# Patient Record
Sex: Female | Born: 1970 | Race: Black or African American | Hispanic: No | Marital: Married | Smoking: Never smoker
Health system: Southern US, Community
[De-identification: ages and names within clinical notes are randomized; demographics above are authoritative.]

## PROBLEM LIST (undated history)

## (undated) ENCOUNTER — Ambulatory Visit (HOSPITAL_COMMUNITY): Admission: EM | Source: Home / Self Care

## (undated) DIAGNOSIS — F319 Bipolar disorder, unspecified: Secondary | ICD-10-CM

## (undated) DIAGNOSIS — F419 Anxiety disorder, unspecified: Secondary | ICD-10-CM

## (undated) DIAGNOSIS — F329 Major depressive disorder, single episode, unspecified: Secondary | ICD-10-CM

## (undated) DIAGNOSIS — F32A Depression, unspecified: Secondary | ICD-10-CM

## (undated) HISTORY — PX: ANKLE SURGERY: SHX546

## (undated) HISTORY — PX: OTHER SURGICAL HISTORY: SHX169

---

## 1997-08-07 ENCOUNTER — Emergency Department (HOSPITAL_COMMUNITY): Admission: EM | Admit: 1997-08-07 | Discharge: 1997-08-07 | Payer: Self-pay | Admitting: Emergency Medicine

## 1998-01-12 ENCOUNTER — Encounter: Admission: RE | Admit: 1998-01-12 | Discharge: 1998-01-12 | Payer: Self-pay | Admitting: Internal Medicine

## 1998-01-12 ENCOUNTER — Other Ambulatory Visit: Admission: RE | Admit: 1998-01-12 | Discharge: 1998-01-12 | Payer: Self-pay | Admitting: *Deleted

## 1998-09-04 ENCOUNTER — Encounter: Admission: RE | Admit: 1998-09-04 | Discharge: 1998-09-04 | Payer: Self-pay | Admitting: Obstetrics & Gynecology

## 1998-09-26 ENCOUNTER — Emergency Department (HOSPITAL_COMMUNITY): Admission: EM | Admit: 1998-09-26 | Discharge: 1998-09-26 | Payer: Self-pay | Admitting: Emergency Medicine

## 1998-10-09 ENCOUNTER — Encounter: Admission: RE | Admit: 1998-10-09 | Discharge: 1998-10-09 | Payer: Self-pay | Admitting: Obstetrics & Gynecology

## 1999-02-05 ENCOUNTER — Emergency Department (HOSPITAL_COMMUNITY): Admission: EM | Admit: 1999-02-05 | Discharge: 1999-02-05 | Payer: Self-pay | Admitting: Emergency Medicine

## 1999-02-25 ENCOUNTER — Emergency Department (HOSPITAL_COMMUNITY): Admission: EM | Admit: 1999-02-25 | Discharge: 1999-02-25 | Payer: Self-pay | Admitting: Emergency Medicine

## 1999-03-21 ENCOUNTER — Other Ambulatory Visit: Admission: RE | Admit: 1999-03-21 | Discharge: 1999-03-21 | Payer: Self-pay | Admitting: Obstetrics and Gynecology

## 1999-03-21 ENCOUNTER — Encounter (INDEPENDENT_AMBULATORY_CARE_PROVIDER_SITE_OTHER): Payer: Self-pay | Admitting: Specialist

## 2000-08-25 ENCOUNTER — Emergency Department (HOSPITAL_COMMUNITY): Admission: EM | Admit: 2000-08-25 | Discharge: 2000-08-25 | Payer: Self-pay | Admitting: Emergency Medicine

## 2000-08-25 ENCOUNTER — Encounter: Payer: Self-pay | Admitting: Emergency Medicine

## 2002-01-23 ENCOUNTER — Emergency Department (HOSPITAL_COMMUNITY): Admission: EM | Admit: 2002-01-23 | Discharge: 2002-01-23 | Payer: Self-pay | Admitting: Emergency Medicine

## 2002-01-25 ENCOUNTER — Inpatient Hospital Stay (HOSPITAL_COMMUNITY): Admission: AD | Admit: 2002-01-25 | Discharge: 2002-01-25 | Payer: Self-pay | Admitting: *Deleted

## 2003-08-23 ENCOUNTER — Emergency Department (HOSPITAL_COMMUNITY): Admission: EM | Admit: 2003-08-23 | Discharge: 2003-08-23 | Payer: Self-pay | Admitting: Family Medicine

## 2004-05-23 ENCOUNTER — Other Ambulatory Visit: Admission: RE | Admit: 2004-05-23 | Discharge: 2004-05-23 | Payer: Self-pay | Admitting: Obstetrics and Gynecology

## 2005-01-06 ENCOUNTER — Emergency Department (HOSPITAL_COMMUNITY): Admission: EM | Admit: 2005-01-06 | Discharge: 2005-01-06 | Payer: Self-pay | Admitting: Family Medicine

## 2005-01-12 ENCOUNTER — Encounter: Admission: RE | Admit: 2005-01-12 | Discharge: 2005-01-12 | Payer: Self-pay | Admitting: Orthopedic Surgery

## 2005-03-13 ENCOUNTER — Inpatient Hospital Stay (HOSPITAL_COMMUNITY): Admission: RE | Admit: 2005-03-13 | Discharge: 2005-03-16 | Payer: Self-pay | Admitting: Orthopedic Surgery

## 2005-12-11 ENCOUNTER — Emergency Department (HOSPITAL_COMMUNITY): Admission: EM | Admit: 2005-12-11 | Discharge: 2005-12-11 | Payer: Self-pay | Admitting: Family Medicine

## 2006-06-04 ENCOUNTER — Emergency Department (HOSPITAL_COMMUNITY): Admission: EM | Admit: 2006-06-04 | Discharge: 2006-06-04 | Payer: Self-pay | Admitting: Family Medicine

## 2007-02-02 ENCOUNTER — Emergency Department (HOSPITAL_COMMUNITY): Admission: EM | Admit: 2007-02-02 | Discharge: 2007-02-02 | Payer: Self-pay | Admitting: Emergency Medicine

## 2007-07-14 ENCOUNTER — Encounter: Admission: RE | Admit: 2007-07-14 | Discharge: 2007-07-14 | Payer: Self-pay | Admitting: Orthopedic Surgery

## 2007-07-27 ENCOUNTER — Encounter: Admission: RE | Admit: 2007-07-27 | Discharge: 2007-07-27 | Payer: Self-pay | Admitting: Obstetrics

## 2007-09-01 ENCOUNTER — Ambulatory Visit (HOSPITAL_COMMUNITY): Admission: RE | Admit: 2007-09-01 | Discharge: 2007-09-01 | Payer: Self-pay | Admitting: Obstetrics

## 2007-09-22 ENCOUNTER — Ambulatory Visit (HOSPITAL_BASED_OUTPATIENT_CLINIC_OR_DEPARTMENT_OTHER): Admission: RE | Admit: 2007-09-22 | Discharge: 2007-09-23 | Payer: Self-pay | Admitting: Orthopedic Surgery

## 2009-01-10 ENCOUNTER — Encounter: Admission: RE | Admit: 2009-01-10 | Discharge: 2009-01-10 | Payer: Self-pay | Admitting: Family Medicine

## 2009-01-12 ENCOUNTER — Encounter: Admission: RE | Admit: 2009-01-12 | Discharge: 2009-01-12 | Payer: Self-pay | Admitting: Orthopedic Surgery

## 2009-10-16 ENCOUNTER — Emergency Department (HOSPITAL_COMMUNITY): Admission: EM | Admit: 2009-10-16 | Discharge: 2009-10-16 | Payer: Self-pay | Admitting: Emergency Medicine

## 2009-11-28 ENCOUNTER — Other Ambulatory Visit: Admission: RE | Admit: 2009-11-28 | Discharge: 2009-11-28 | Payer: Self-pay | Admitting: Family Medicine

## 2010-01-14 ENCOUNTER — Encounter: Admission: RE | Admit: 2010-01-14 | Discharge: 2010-01-14 | Payer: Self-pay | Admitting: Family Medicine

## 2010-07-22 LAB — POCT URINALYSIS DIP (DEVICE)
Nitrite: NEGATIVE
Protein, ur: NEGATIVE mg/dL
Specific Gravity, Urine: 1.02 (ref 1.005–1.030)
Urobilinogen, UA: 0.2 mg/dL (ref 0.0–1.0)

## 2010-07-22 LAB — GLUCOSE, CAPILLARY: Glucose-Capillary: 111 mg/dL — ABNORMAL HIGH (ref 70–99)

## 2010-09-17 NOTE — Op Note (Signed)
[name], [name]            ACCOUNT NO.:  [id number]   MEDICAL RECORD NO.:  [id number]          PATIENT TYPE:  AMB   LOCATION:  DSC                          FACILITY:  [facility]   PHYSICIAN:  Leonides Grills, M.D.     DATE OF BIRTH:  [birth year]   DATE OF PROCEDURE:  09/22/2007  DATE OF DISCHARGE:                               OPERATIVE REPORT   PREOPERATIVE DIAGNOSES:  1. Complication of hardware, right foot.  2. Right triple arthrodesis, nonunion.  3. Right tight Achilles tendon.  4. 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   POSTOPERATIVE DIAGNOSES:  1. Complication of hardware, right foot.  2. Right triple arthrodesis, nonunion.  3. Right tight Achilles tendon.  4. 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   OPERATION:  1. Hardware removal, deep right foot.  2. Revision right triple arthrodesis with INFUSE augmentation.  3. Right iliac crest bone graft.  4. Right percutaneous tendo-Achilles lengthening.  5. Right peroneus longus and peroneus brevis tenotomies.  6. Stress x-rays.   ANESTHESIA:  General.   SURGEON:  Leonides Grills, MD   ASSISTANT:  Richardean Canal, PA-C   ESTIMATED BLOOD LOSS:  Minimal.   TOURNIQUET TIME:  2 hours.   COMPLICATIONS:  None.   DISPOSITION:  Stable to the PR.   INDICATIONS:  This is a 40 year old female who previously had a right  triple arthrodesis.  Postoperatively, she smoked marijuana and  subsequently developed a nonunion.  She was consented for the above  procedure.  All risks, which include infection, neurovascular injury,  nonunion, malunion, hardware irritation, hardware failure, persistent  pain, worsening pain, prolonged recovery, stiffness, arthritis, talar  tilt, and possible ligamentous insufficiency were all explained.  Questions were encouraged and answered.   OPERATION:  The patient was brought to the operating room and placed in  supine position.  After adequate general endotracheal  tube anesthesia  was administered as well as Ancef 1 g IV piggyback, bump was placed in  the right ipsilateral hip, and the right lower extremity as well as  right iliac crest bone graft site were prepped and draped in a sterile  manner over proximally placed thigh tourniquet.  We started the  procedure with a longitudinal incision over the right iliac crest graft  site.  Dissection was carried down through skin.  Hemostasis was  obtained.  Dissection was carried down to the iliac crest graft site  through the pannus.  Once we were there, we created a trap door using a  curved Converse osteotome and a sagittal saw and then removed cancellous  bone with a curette and Russian forceps, placed this on the back table.  We copiously irrigated the area with normal saline, applied the trap  door back, secured this with suture and drill holes, and then closed  deep tissue with 0-Vicryl.  Subcu was closed with 2-0 Vicryl.  Skin was  closed with 4-0 Monocryl.  Subcuticular stitch and Steri-Strips were  applied.  Marcaine was applied to the wound as well.  We then performed  a percutaneous tendo-Achilles lengthening with two medial and one  lateral hemisections  to the Achilles tendon.  This had an excellent  release tight Achilles tendon.  We then made a longitudinal incision  over the anterolateral aspect of the ankle where the previous incision  was then made.  Dissection was carried down through skin.  Hemostasis  was obtained.  Anterior tibialis tendon was identified and this was  retracted laterally out of harm's way.  Dissection was carried carefully  dissecting down to the dorsal aspect of the talar neck area and the  screw head was then identified.  This was then removed with a cannulated  large frag screwdriver, and this was intact with no evidence of hardware  failure.  We then made a longitudinal incision over the medial aspect of  the talonavicular joint just dorsal to the posterior tibial  tendon.  Dissection was carried down through the skin.  Hemostasis was obtained.  We then dissected just dorsal to the posterior tibial tendon and then  identified the 3.5 fully-threaded cortical screw.  This was then removed  as well.  This was broken.  We left the shaft of the screw in the talus  as rebar.  We then made an Ollier incision over the sinus tarsi area.  Dissection was carried down through the skin.  Hemostasis was obtained.  EDB was then elevated and scar tissue was removed from the sinus tarsi  area and lateral aspect talonavicular joint.  Through a percutaneous  incision distally, we then placed the small frag screwdriver and through  the Ollier incision found the screw crossing the calcaneal cuboid joint.  This was also removed as well.  This joint as well as subtalar joint and  talonavicular joint were grossly nonunited with fibrous material.  This  took quite some time to remove all the fibrous material from all three  wounds.  We incorporated all three wounds to perform this, which was  done with a curved quarter-inch osteotome, synovectomy rongeur, and  under C-arm guidance periodically throughout the procedure as well.  Once all the joints were removed of the scar tissue and fibrous union,  we then performed either fish scaling with a curved quarter-inch  osteotome or iatrogenic vascular channels with a 10-mm drill over the  entire joint surfaces of the talonavicular joint, subtalar joint, and  then calcaneal cuboid joint.  We also released the peroneus brevis and  peroneus longus tendons as well for they were tight and tendinosed.  Once this was done, we started the fixation process.  We placed the foot  on a radiolucent triangle, made a longitudinal incision in the plantar  aspect of the heel.  We then placed a 6.5 mm x 16 mm threaded cancellous  lag screw using 4.5 mm x 3.2 mm drill holes respectively.  This had  excellent purchase and maintenance of the  compression across the  subtalar joint.  Prior to placing this, we placed the iliac crest bone  graft from the hip into the subtalar joint as well as an INFUSE BMP  wafer in the subtalar joint as well, and then applied the screw.  Once  the screw was placed, this was verified in a proper position in the AP  lateral views of the ankle.  We then obtained stress x-rays of the foot,  which showed that there was no gross motion across the fusion site,  fixation, proper position, and excellent alignment as well.  We then  applied the talonavicular joint screw using again 6.5 mm x 16 mm  threaded cancellous  screw using 4.5 mm x 3.2 mm drill holes  respectively.  Again prior to putting the screw, we placed bone graft,  iliac crest graft as well as an INFUSE wafer.  The screw was placed with  excellent purchase and compression across fusion site.  Again this was  verified C-arm guidance.  AP and lateral planes showed no gross motion,  fixation, proper position, excellent alignment as well.  We then  attempted to place a 6.5 mm x 16 mm threaded cancellous screw across the  CC joint.  This was done with a 4.5 x 3.2 mm drill hole respectively.  During this process of doing this, the anterior process broke, and  because of this we used this as graft and packed this again into the  subtalar joint, lateral talonavicular joint, and CC joint area.  We  placed iliac crest graft as well into the CC joint as well as an INFUSE  wafer into this joint as well.  We then chose a Synthes small fragment  approximately 30-degree angled locking T plate and applied this to the  lateral wall of the cuboid as well as the calcaneus.  Once this was  placed, we then placed two distal-locking screws as well as a normal  screw using either 2.8 as well as 2.5-mm drill holes respectively, all  of which had excellent purchase and maintenance of the plate in a proper  position.  We then compressed the CC joint by putting the  3.5-mm fully  threaded cortical screws in a compression mode across the plate.  This  had outstanding compression and maintenance of the reduction.  This was  verified under C-arm guidance.  AP and lateral planes showed no gross  motion fixation, proper position, and excellent alignment as well.  Final stress x-rays were obtained in AP and lateral planes as well as AP  ankle, which showed no gross motion fixation, proper position, and  excellent alignment as well.  The area was copiously irrigated.  Tourniquet was deflated prior to this.  Hemostasis was obtained.  There  was no pulsatile bleeding at the end of procedure.  Deep tissues were  closed with 2-0 Vicryl, subcu was closed with 3-0 Vicryl, and skin was  closed with 4-0 nylon over all wounds.  Sterile dressing was applied.  Modified Jones dressing was applied to the ankle with the knee in  dorsiflexion.  The patient was stable to the PR.      Leonides Grills, M.D.  Electronically Signed     PB/MEDQ  D:  09/22/2007  T:  09/23/2007  Job:  [id number]

## 2010-09-20 NOTE — Op Note (Signed)
[name], [name]            ACCOUNT NO.:  [id number]   MEDICAL RECORD NO.:  [id number]          PATIENT TYPE:  INP   LOCATION:  5005                         FACILITY:  [facility]   PHYSICIAN:  Nadara Mustard, MD     DATE OF BIRTH:  December 02, 1970   DATE OF PROCEDURE:  03/13/2005  DATE OF DISCHARGE:                                 OPERATIVE REPORT   PREOPERATIVE DIAGNOSIS:  Right foot fixed pronated valgus hind foot with  posterior tibial tendon insufficiency.   POSTOPERATIVE DIAGNOSIS:  Right foot fixed pronated valgus hind foot with  posterior tibial tendon insufficiency.   PROCEDURE:  Triple arthrodesis, right foot.   SURGEON:  Nadara Mustard, M.D.   ANESTHESIA:  General.   ESTIMATED BLOOD LOSS:  Minimal.   ANTIBIOTICS:  Kefzol 1 g.   DRAINS:  None.   COMPLICATIONS:  None.   TOURNIQUET TIME:  Esmarch at the ankle for approximately 60 minutes.   DISPOSITION:  To PACU in stable condition.   INDICATIONS FOR PROCEDURE:  The patient is a 40 year old woman with a  posterior tibial tendon insufficiency of her right foot. She has failed  conservative care, has pain with activities of daily living and presents at  this time for triple arthrodesis. The risks and benefits of surgery were  discussed including infection, neurovascular injury, nonhealing of the  wounds, persistent pain, need for additional surgery. The patient states he  understands and wished proceed at this time.   DESCRIPTION OF PROCEDURE:  The patient was brought to OR room #7 and  underwent general anesthetic. After adequate level of anesthesia obtained,  the patient's right lower extremity was prepped using DuraPrep and draped  into a sterile field. An oblique incision was made over the sinus tarsi  after the foot was elevated and Esmarch was wrapped around the ankle for  tourniquet control. This was carried down.  The extensor brevis muscle was  elevated and the subtalar joint was debrided. The posterior  facette was  debrided of articular cartilage and the calcaneocuboid joint was also  debrided. There was significant degenerative changes of the calcaneocuboid  joint with large bony spurs. The talonavicular joint was then debrided third  and this was done through a dorsal incision with the anterior tibial tendon  retracted laterally and the talonavicular joint debrided from medial aspect  of the anterior tibial tendon. This was debrided of all articular cartilage.  The foot was then held in slight valgus with neutral plantar flexion and a  guidewire then inserted from the talar neck into the calcaneus obliquely.  This was measured and a 75 mm 7.3 screw was then inserted to stabilize the  subtalar joint. A second stab incision was then made and the calcaneocuboid  joint was fused using a 45 mm long 3.5 cortical screw. A third screw was  then used to stabilize the talonavicular joint and this was stabilized with  a 50 mm screw. C-arm fluoroscopy verified reduction of both AP and lateral  planes. She had good motion of her ankle with no crepitation and no  instability. The wounds were irrigated with  normal saline. The Esmarch was  released after approximately 60 minutes. The subtalar joint and the fusion  joints were packed with Grafton demineralized bone matrix and cancellous  chips. The subcu and muscle fascia was closed using 2-0 Vicryl. Skin was  closed using far-near-near-far suture with 2-0 nylon. The wound was covered  with Adaptic orthopedic sponges, sterile Webril and a Coban dressing. The  patient was extubated and taken to the PACU in stable condition.      Nadara Mustard, MD  Electronically Signed     MVD/MEDQ  D:  03/13/2005  T:  03/13/2005  Job:  [id number]

## 2010-09-20 NOTE — Discharge Summary (Signed)
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   PROCEDURE:  Right foot triple arthrodesis.   Discharged to home in stable condition with home health therapy.  Plan to  follow up in office in 2 weeks.   HISTORY OF PRESENT ILLNESS:  The patient is a 40 year old woman with a  chronic right foot pain for over a year.  She has a fixed pronated valgus  hind foot due to posterior tibial tendon insufficiency.  The patient has  failed conservative care including nonsteroidals, bracing, immobilization  and activity modification and wished to proceed with surgical intervention.  The patient underwent a right foot triple arthrodesis on March 13, 2005.  Postoperatively, she progressed well.  Her PCA was discontinued on  postoperative day two.  She was placed in a fracture boot.  The patient was  able to independently ambulate with nonweightbearing on the right and was  discharged to home in stable condition with follow-up in office in 2 weeks  on March 16, 2005.      Nadara Mustard, MD  Electronically Signed     MVD/MEDQ  D:  08/01/2005  T:  08/04/2005  Job:  [id number]

## 2010-12-12 ENCOUNTER — Other Ambulatory Visit: Payer: Self-pay | Admitting: Family Medicine

## 2010-12-12 DIAGNOSIS — Z1231 Encounter for screening mammogram for malignant neoplasm of breast: Secondary | ICD-10-CM

## 2011-01-17 ENCOUNTER — Ambulatory Visit
Admission: RE | Admit: 2011-01-17 | Discharge: 2011-01-17 | Disposition: A | Discharge status: Home / Self Care | Payer: BC Managed Care – PPO | Source: Ambulatory Visit | Attending: Family Medicine | Admitting: Family Medicine

## 2011-01-17 DIAGNOSIS — Z1231 Encounter for screening mammogram for malignant neoplasm of breast: Secondary | ICD-10-CM

## 2011-03-24 ENCOUNTER — Emergency Department (HOSPITAL_COMMUNITY)
Admission: EM | Admit: 2011-03-24 | Discharge: 2011-03-24 | Disposition: A | Discharge status: Home / Self Care | Payer: BC Managed Care – PPO

## 2011-12-11 ENCOUNTER — Other Ambulatory Visit: Payer: Self-pay | Admitting: Family Medicine

## 2011-12-11 DIAGNOSIS — Z1231 Encounter for screening mammogram for malignant neoplasm of breast: Secondary | ICD-10-CM

## 2012-01-19 ENCOUNTER — Ambulatory Visit: Payer: BC Managed Care – PPO

## 2012-01-21 ENCOUNTER — Ambulatory Visit
Admission: RE | Admit: 2012-01-21 | Discharge: 2012-01-21 | Disposition: A | Discharge status: Home / Self Care | Payer: BC Managed Care – PPO | Source: Ambulatory Visit | Attending: Family Medicine | Admitting: Family Medicine

## 2012-01-21 DIAGNOSIS — Z1231 Encounter for screening mammogram for malignant neoplasm of breast: Secondary | ICD-10-CM

## 2012-01-22 ENCOUNTER — Ambulatory Visit: Payer: BC Managed Care – PPO

## 2012-02-09 ENCOUNTER — Ambulatory Visit: Payer: BC Managed Care – PPO | Admitting: *Deleted

## 2012-06-08 ENCOUNTER — Other Ambulatory Visit: Payer: Self-pay | Admitting: Nurse Practitioner

## 2012-06-08 ENCOUNTER — Other Ambulatory Visit (HOSPITAL_COMMUNITY)
Admission: RE | Admit: 2012-06-08 | Discharge: 2012-06-08 | Disposition: A | Discharge status: Home / Self Care | Payer: BC Managed Care – PPO | Source: Ambulatory Visit | Attending: Obstetrics and Gynecology | Admitting: Obstetrics and Gynecology

## 2012-06-08 DIAGNOSIS — Z01419 Encounter for gynecological examination (general) (routine) without abnormal findings: Secondary | ICD-10-CM | POA: Insufficient documentation

## 2012-06-08 DIAGNOSIS — Z1151 Encounter for screening for human papillomavirus (HPV): Secondary | ICD-10-CM | POA: Insufficient documentation

## 2012-06-08 DIAGNOSIS — N760 Acute vaginitis: Secondary | ICD-10-CM | POA: Insufficient documentation

## 2012-06-14 ENCOUNTER — Telehealth: Payer: Self-pay | Admitting: Genetic Counselor

## 2012-06-14 NOTE — Telephone Encounter (Signed)
S/W pt in re Genetic appt 02/17 @ 10 w/Karen Lowell Guitar.  Welcome packet mailed.

## 2012-06-21 ENCOUNTER — Encounter: Payer: BC Managed Care – PPO | Admitting: Genetic Counselor

## 2012-06-21 ENCOUNTER — Other Ambulatory Visit: Payer: BC Managed Care – PPO | Admitting: Lab

## 2012-07-01 ENCOUNTER — Other Ambulatory Visit: Payer: Self-pay | Admitting: Nurse Practitioner

## 2012-12-09 ENCOUNTER — Encounter (HOSPITAL_COMMUNITY): Payer: Self-pay | Admitting: Pharmacist

## 2012-12-20 ENCOUNTER — Ambulatory Visit (HOSPITAL_COMMUNITY)
Admission: RE | Admit: 2012-12-20 | Payer: BC Managed Care – PPO | Source: Ambulatory Visit | Admitting: Obstetrics and Gynecology

## 2012-12-20 ENCOUNTER — Encounter (HOSPITAL_COMMUNITY): Admission: RE | Payer: Self-pay | Source: Ambulatory Visit

## 2012-12-20 SURGERY — DILATION AND CURETTAGE
Anesthesia: Choice

## 2013-01-11 ENCOUNTER — Other Ambulatory Visit: Payer: Self-pay

## 2013-01-11 DIAGNOSIS — Z1231 Encounter for screening mammogram for malignant neoplasm of breast: Secondary | ICD-10-CM

## 2013-01-25 ENCOUNTER — Ambulatory Visit
Admission: RE | Admit: 2013-01-25 | Discharge: 2013-01-25 | Disposition: A | Discharge status: Home / Self Care | Payer: BC Managed Care – PPO | Source: Ambulatory Visit

## 2013-01-25 DIAGNOSIS — Z1231 Encounter for screening mammogram for malignant neoplasm of breast: Secondary | ICD-10-CM

## 2013-06-02 ENCOUNTER — Ambulatory Visit (HOSPITAL_COMMUNITY)
Admission: RE | Admit: 2013-06-02 | Payer: BC Managed Care – PPO | Source: Ambulatory Visit | Admitting: Obstetrics and Gynecology

## 2013-06-02 ENCOUNTER — Encounter (HOSPITAL_COMMUNITY): Admission: RE | Payer: Self-pay | Source: Ambulatory Visit

## 2013-06-02 SURGERY — LAPAROSCOPY, DIAGNOSTIC
Anesthesia: Choice

## 2013-06-08 ENCOUNTER — Ambulatory Visit: Payer: BC Managed Care – PPO | Admitting: Licensed Clinical Social Worker

## 2013-09-28 ENCOUNTER — Emergency Department (INDEPENDENT_AMBULATORY_CARE_PROVIDER_SITE_OTHER)
Admission: EM | Admit: 2013-09-28 | Discharge: 2013-09-28 | Disposition: A | Discharge status: Home / Self Care | Payer: BC Managed Care – PPO | Source: Home / Self Care

## 2013-09-28 ENCOUNTER — Emergency Department (INDEPENDENT_AMBULATORY_CARE_PROVIDER_SITE_OTHER): Payer: BC Managed Care – PPO

## 2013-09-28 ENCOUNTER — Encounter (HOSPITAL_COMMUNITY): Payer: Self-pay | Admitting: Emergency Medicine

## 2013-09-28 DIAGNOSIS — M25579 Pain in unspecified ankle and joints of unspecified foot: Secondary | ICD-10-CM

## 2013-09-28 DIAGNOSIS — M25571 Pain in right ankle and joints of right foot: Secondary | ICD-10-CM

## 2013-09-28 MED ORDER — HYDROCODONE-ACETAMINOPHEN 5-325 MG PO TABS: 1.0000 | ORAL_TABLET | ORAL | Status: DC | PRN

## 2013-09-28 NOTE — ED Notes (Signed)
Patient requests to apply ASO on return to home, as she does not have a sock w her

## 2013-09-28 NOTE — ED Provider Notes (Signed)
CSN: [id number]     Arrival date & time 09/28/13  1330 History   First MD Initiated Contact with Patient 09/28/13 1411     Chief Complaint  Patient presents with  . Ankle Pain   (Consider location/radiation/quality/duration/timing/severity/associated sxs/prior Treatment) HPI Comments: 43 year old female complaining of right ankle pain. She is status post surgical procedures to the ankle for preoperative diagnoses including complication of hardware right foot, apply triple arthrodesis, nonunion, right eye Achilles tendon, pronator brevis longus tendinopathy with contracture.. She has also had revision of a right triple arthrodesis, bone graft. This note dictated 09/17/2010. Pt st she has daily pain. 3 d ago experienced increased pain in the ankle and persists. No known new injury.    History reviewed. No pertinent past medical history. Past Surgical History  Procedure Laterality Date  . Ankle surgery     History reviewed. No pertinent family history. History  Substance Use Topics  . Smoking status: Never Smoker   . Smokeless tobacco: Not on file  . Alcohol Use: No   OB History   Grav Para Term Preterm Abortions TAB SAB Ect Mult Living                 Review of Systems  Constitutional: Negative for fever, chills and activity change.  Respiratory: Negative.   Musculoskeletal: Positive for gait problem.       As per HPI  Skin: Negative for color change, pallor and rash.  Neurological: Negative.     Allergies  Review of patient's allergies indicates no known allergies.  Home Medications   Prior to Admission medications   Medication Sig Start Date End Date Taking? Authorizing Provider  ibuprofen (ADVIL,MOTRIN) 800 MG tablet Take 800 mg by mouth every 8 (eight) hours as needed for pain.    Historical Provider, MD  norethindrone (MICRONOR,CAMILA,ERRIN) 0.35 MG tablet Take 1 tablet by mouth daily. Nora-BE 0.35 mg    Historical Provider, MD  OVER THE COUNTER MEDICATION Take 500  mg by mouth daily as needed. Infinity  (weight loss pill)    Historical Provider, MD   BP 134/85  Pulse 90  Temp(Src) 98.2 F (36.8 C) (Oral)  Resp 18  SpO2 97%  LMP 09/13/2013 Physical Exam  Nursing note and vitals reviewed. Constitutional: She is oriented to person, place, and time. She appears well-developed and well-nourished. No distress.  Neck: Normal range of motion. Neck supple.  Pulmonary/Chest: Effort normal. No respiratory distress.  Musculoskeletal:  Mild puffiness of ankle. Generalized ankle tenderness. Pt does not wish to move it around due to pain. No erythema or other signs of infection. No open wounds. Able to wiggle toes.  Neurological: She is alert and oriented to person, place, and time.  Skin: Skin is warm and dry.    ED Course  Procedures (including critical care time) Labs Review Labs Reviewed - No data to display  Imaging Review Dg Ankle Complete Right  09/28/2013   CLINICAL DATA:  Persistent right foot and ankle pain. Previous hindfoot fusion including the talus, calcaneus, cuboid, and navicular.  EXAM: RIGHT ANKLE - COMPLETE 3+ VIEW  COMPARISON:  CT scan dated 01/12/2009  FINDINGS: Side plate and multiple screws are present in the talus and calcaneus and cuboid and navicular. These appear unchanged. No evidence of loosening. 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 There is soft tissue swelling around the ankle. There is slight progression of dorsal spurring at the joint between the navicular  and the cuneiforms and at the tarsometatarsal joints dorsally.  No acute osseous abnormalities. Calcification in the plantar fascia is unchanged.  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   Electronically Signed   By: Geanie Cooley M.D.   On: 09/28/2013 14:58     MDM   1. Right ankle pain    ASO RICE NOrco 5 mg #15 F/U with  PCP    Hayden Rasmussen, NP 09/28/13 1546

## 2013-09-28 NOTE — ED Notes (Signed)
States she has been having problem w her right ankle for some time. Most recently, has ben having pain past 3-4 days  ( no injury ) made statement she thinks a screw may have broken off from one of her ankle surgeries

## 2013-09-28 NOTE — Discharge Instructions (Signed)
Ankle Pain °Ankle pain is a common symptom. The bones, cartilage, tendons, and muscles of the ankle joint perform a lot of work each day. The ankle joint holds your body weight and allows you to move around. Ankle pain can occur on either side or back of 1 or both ankles. Ankle pain may be sharp and burning or dull and aching. There may be tenderness, stiffness, redness, or warmth around the ankle. The pain occurs more often when a person walks or puts pressure on the ankle. °CAUSES  °There are many reasons ankle pain can develop. It is important to work with your caregiver to identify the cause since many conditions can impact the bones, cartilage, muscles, and tendons. Causes for ankle pain include: °· Injury, including a break (fracture), sprain, or strain often due to a fall, sports, or a high-impact activity. °· Swelling (inflammation) of a tendon (tendonitis). °· Achilles tendon rupture. °· Ankle instability after repeated sprains and strains. °· Poor foot alignment. °· Pressure on a nerve (tarsal tunnel syndrome). °· Arthritis in the ankle or the lining of the ankle. °· Crystal formation in the ankle (gout or pseudogout). °DIAGNOSIS  °A diagnosis is based on your medical history, your symptoms, results of your physical exam, and results of diagnostic tests. Diagnostic tests may include X-ray exams or a computerized magnetic scan (magnetic resonance imaging, MRI). °TREATMENT  °Treatment will depend on the cause of your ankle pain and may include: °· Keeping pressure off the ankle and limiting activities. °· Using crutches or other walking support (a cane or brace). °· Using rest, ice, compression, and elevation. °· Participating in physical therapy or home exercises. °· Wearing shoe inserts or special shoes. °· Losing weight. °· Taking medications to reduce pain or swelling or receiving an injection. °· Undergoing surgery. °HOME CARE INSTRUCTIONS  °· Only take over-the-counter or prescription medicines for  pain, discomfort, or fever as directed by your caregiver. °· Put ice on the injured area. °· Put ice in a plastic bag. °· Place a towel between your skin and the bag. °· Leave the ice on for 15-20 minutes at a time, 03-04 times a day. °· Keep your leg raised (elevated) when possible to lessen swelling. °· Avoid activities that cause ankle pain. °· Follow specific exercises as directed by your caregiver. °· Record how often you have ankle pain, the location of the pain, and what it feels like. This information may be helpful to you and your caregiver. °· Ask your caregiver about returning to work or sports and whether you should drive. °· Follow up with your caregiver for further examination, therapy, or testing as directed. °SEEK MEDICAL CARE IF:  °· Pain or swelling continues or worsens beyond 1 week. °· You have an oral temperature above 102° F (38.9° C). °· You are feeling unwell or have chills. °· You are having an increasingly difficult time with walking. °· You have loss of sensation or other new symptoms. °· You have questions or concerns. °MAKE SURE YOU:  °· Understand these instructions. °· Will watch your condition. °· Will get help right away if you are not doing well or get worse. °Document Released: 10/09/2009 Document Revised: 07/14/2011 Document Reviewed: 10/09/2009 °[employer]® Patient Information ©2014 [employer]. ° °Arthralgia °Arthralgia is joint pain. A joint is a place where two bones meet. Joint pain can happen for many reasons. The joint can be bruised, stiff, infected, or weak from aging. Pain usually goes away after resting and taking medicine for   soreness.  °HOME CARE °· Rest the joint as told by your doctor. °· Keep the sore joint raised (elevated) for the first 24 hours. °· Put ice on the joint area. °· Put ice in a plastic bag. °· Place a towel between your skin and the bag. °· Leave the ice on for 15-20 minutes, 03-04 times a day. °· Wear your splint, casting, elastic bandage, or sling  as told by your doctor. °· Only take medicine as told by your doctor. Do not take aspirin. °· Use crutches as told by your doctor. Do not put weight on the joint until told to by your doctor. °GET HELP RIGHT AWAY IF:  °· You have bruising, puffiness (swelling), or more pain. °· Your fingers or toes turn blue or start to lose feeling (numb). °· Your medicine does not lessen the pain. °· Your pain becomes severe. °· You have a temperature by mouth above 102° F (38.9° C), not controlled by medicine. °· You cannot move or use the joint. °MAKE SURE YOU:  °· Understand these instructions. °· Will watch your condition. °· Will get help right away if you are not doing well or get worse. °Document Released: 04/09/2009 Document Revised: 07/14/2011 Document Reviewed: 04/09/2009 °Exit[employer]® Patient Information ©2014 [employer]. ° °

## 2013-09-28 NOTE — ED Notes (Signed)
Pt declined appliance application, but says she will put it on when she returns to her home. Pt. Was given XL size ASO for her stated shoe size of ladies' 14".

## 2013-09-29 NOTE — ED Provider Notes (Signed)
Medical screening examination/treatment/procedure(s) were performed by a resident physician or non-physician practitioner and as the supervising physician I was immediately available for consultation/collaboration.  Yasuko Lapage, MD    Kell Ferris S Skyelar Swigart, MD 09/29/13 0754 

## 2014-01-17 ENCOUNTER — Other Ambulatory Visit: Payer: Self-pay

## 2014-01-17 DIAGNOSIS — Z1231 Encounter for screening mammogram for malignant neoplasm of breast: Secondary | ICD-10-CM

## 2014-03-15 ENCOUNTER — Ambulatory Visit: Payer: BC Managed Care – PPO

## 2014-03-17 ENCOUNTER — Ambulatory Visit: Payer: BC Managed Care – PPO

## 2014-03-20 ENCOUNTER — Ambulatory Visit
Admission: RE | Admit: 2014-03-20 | Discharge: 2014-03-20 | Disposition: A | Discharge status: Home / Self Care | Payer: 59 | Source: Ambulatory Visit

## 2014-03-20 DIAGNOSIS — Z1231 Encounter for screening mammogram for malignant neoplasm of breast: Secondary | ICD-10-CM

## 2014-12-28 ENCOUNTER — Emergency Department (INDEPENDENT_AMBULATORY_CARE_PROVIDER_SITE_OTHER)
Admission: EM | Admit: 2014-12-28 | Discharge: 2014-12-28 | Disposition: A | Discharge status: Home / Self Care | Payer: Self-pay | Source: Home / Self Care | Attending: Family Medicine | Admitting: Family Medicine

## 2014-12-28 ENCOUNTER — Encounter (HOSPITAL_COMMUNITY): Payer: Self-pay | Admitting: Emergency Medicine

## 2014-12-28 DIAGNOSIS — M5430 Sciatica, unspecified side: Secondary | ICD-10-CM

## 2014-12-28 DIAGNOSIS — M62830 Muscle spasm of back: Secondary | ICD-10-CM

## 2014-12-28 HISTORY — DX: Depression, unspecified: F32.A

## 2014-12-28 HISTORY — DX: Major depressive disorder, single episode, unspecified: F32.9

## 2014-12-28 MED ORDER — METHOCARBAMOL 500 MG PO TABS
500.0000 mg | ORAL_TABLET | Freq: Four times a day (QID) | ORAL | Status: DC | PRN
Start: 2014-12-28 — End: 2015-09-01

## 2014-12-28 MED ORDER — DICLOFENAC SODIUM 75 MG PO TBEC: 75.0000 mg | DELAYED_RELEASE_TABLET | Freq: Two times a day (BID) | ORAL | Status: DC

## 2014-12-28 MED ORDER — KETOROLAC TROMETHAMINE 60 MG/2ML IM SOLN
INTRAMUSCULAR | Status: AC
  Filled 2014-12-28: qty 2

## 2014-12-28 MED ORDER — PREDNISONE 50 MG PO TABS: ORAL_TABLET | ORAL | Status: DC

## 2014-12-28 MED ORDER — KETOROLAC TROMETHAMINE 60 MG/2ML IM SOLN
60.0000 mg | Freq: Once | INTRAMUSCULAR | Status: AC
  Administered 2014-12-28: 60 mg via INTRAMUSCULAR

## 2014-12-28 NOTE — Discharge Instructions (Signed)
Your condition is likely due to muscle spasm of the lower back causing pinching of the nerves, primarily the sciatic nerve. Please begin the exercises outlined below. Your given a dose of Toradol in our clinic to help jump start your recovery period please start taking the steroids tomorrow morning with breakfast. Please start the Voltaren medicine after finishing the steroids. You may use the Robaxin for additional pain and spasm relief. The Robaxin may make you sleepy. Please remember to stay active.   Sciatica with Rehab The sciatic nerve runs from the back down the leg and is responsible for sensation and control of the muscles in the back (posterior) side of the thigh, lower leg, and foot. Sciatica is a condition that is characterized by inflammation of this nerve.  SYMPTOMS   Signs of nerve damage, including numbness and/or weakness along the posterior side of the lower extremity.  Pain in the back of the thigh that may also travel down the leg.  Pain that worsens when sitting for long periods of time.  Occasionally, pain in the back or buttock. CAUSES  Inflammation of the sciatic nerve is the cause of sciatica. The inflammation is due to something irritating the nerve. Common sources of irritation include:  Sitting for long periods of time.  Direct trauma to the nerve.  Arthritis of the spine.  Herniated or ruptured disk.  Slipping of the vertebrae (spondylolisthesis).  Pressure from soft tissues, such as muscles or ligament-like tissue (fascia). RISK INCREASES WITH:  Sports that place pressure or stress on the spine (football or weightlifting).  Poor strength and flexibility.  Failure to warm up properly before activity.  Family history of low back pain or disk disorders.  Previous back injury or surgery.  Poor body mechanics, especially when lifting, or poor posture. PREVENTION   Warm up and stretch properly before activity.  Maintain physical  fitness:  Strength, flexibility, and endurance.  Cardiovascular fitness.  Learn and use proper technique, especially with posture and lifting. When possible, have coach correct improper technique.  Avoid activities that place stress on the spine. PROGNOSIS If treated properly, then sciatica usually resolves within 6 weeks. However, occasionally surgery is necessary.  RELATED COMPLICATIONS   Permanent nerve damage, including pain, numbness, tingle, or weakness.  Chronic back pain.  Risks of surgery: infection, bleeding, nerve damage, or damage to surrounding tissues. TREATMENT Treatment initially involves resting from any activities that aggravate your symptoms. The use of ice and medication may help reduce pain and inflammation. The use of strengthening and stretching exercises may help reduce pain with activity. These exercises may be performed at home or with referral to a therapist. A therapist may recommend further treatments, such as transcutaneous electronic nerve stimulation (TENS) or ultrasound. Your caregiver may recommend corticosteroid injections to help reduce inflammation of the sciatic nerve. If symptoms persist despite non-surgical (conservative) treatment, then surgery may be recommended. MEDICATION  If pain medication is necessary, then nonsteroidal anti-inflammatory medications, such as aspirin and ibuprofen, or other minor pain relievers, such as acetaminophen, are often recommended.  Do not take pain medication for 7 days before surgery.  Prescription pain relievers may be given if deemed necessary by your caregiver. Use only as directed and only as much as you need.  Ointments applied to the skin may be helpful.  Corticosteroid injections may be given by your caregiver. These injections should be reserved for the most serious cases, because they may only be given a certain number of times. HEAT AND COLD  Cold treatment (icing) relieves pain and reduces  inflammation. Cold treatment should be applied for 10 to 15 minutes every 2 to 3 hours for inflammation and pain and immediately after any activity that aggravates your symptoms. Use ice packs or massage the area with a piece of ice (ice massage).  Heat treatment may be used prior to performing the stretching and strengthening activities prescribed by your caregiver, physical therapist, or athletic trainer. Use a heat pack or soak the injury in warm water. SEEK MEDICAL CARE IF:  Treatment seems to offer no benefit, or the condition worsens.  Any medications produce adverse side effects. EXERCISES  RANGE OF MOTION (ROM) AND STRETCHING EXERCISES - Sciatica Most people with sciatic will find that their symptoms worsen with either excessive bending forward (flexion) or arching at the low back (extension). The exercises which will help resolve your symptoms will focus on the opposite motion. Your physician, physical therapist or athletic trainer will help you determine which exercises will be most helpful to resolve your low back pain. Do not complete any exercises without first consulting with your clinician. Discontinue any exercises which worsen your symptoms until you speak to your clinician. If you have pain, numbness or tingling which travels down into your buttocks, leg or foot, the goal of the therapy is for these symptoms to move closer to your back and eventually resolve. Occasionally, these leg symptoms will get better, but your low back pain may worsen; this is typically an indication of progress in your rehabilitation. Be certain to be very alert to any changes in your symptoms and the activities in which you participated in the 24 hours prior to the change. Sharing this information with your clinician will allow him/her to most efficiently treat your condition. These exercises may help you when beginning to rehabilitate your injury. Your symptoms may resolve with or without further involvement  from your physician, physical therapist or athletic trainer. While completing these exercises, remember:   Restoring tissue flexibility helps normal motion to return to the joints. This allows healthier, less painful movement and activity.  An effective stretch should be held for at least 30 seconds.  A stretch should never be painful. You should only feel a gentle lengthening or release in the stretched tissue. FLEXION RANGE OF MOTION AND STRETCHING EXERCISES: STRETCH - Flexion, Single Knee to Chest   Lie on a firm bed or floor with both legs extended in front of you.  Keeping one leg in contact with the floor, bring your opposite knee to your chest. Hold your leg in place by either grabbing behind your thigh or at your knee.  Pull until you feel a gentle stretch in your low back. Hold __________ seconds.  Slowly release your grasp and repeat the exercise with the opposite side. Repeat __________ times. Complete this exercise __________ times per day.  STRETCH - Flexion, Double Knee to Chest  Lie on a firm bed or floor with both legs extended in front of you.  Keeping one leg in contact with the floor, bring your opposite knee to your chest.  Tense your stomach muscles to support your back and then lift your other knee to your chest. Hold your legs in place by either grabbing behind your thighs or at your knees.  Pull both knees toward your chest until you feel a gentle stretch in your low back. Hold __________ seconds.  Tense your stomach muscles and slowly return one leg at a time to the floor. Repeat __________  times. Complete this exercise __________ times per day.  STRETCH - Low Trunk Rotation   Lie on a firm bed or floor. Keeping your legs in front of you, bend your knees so they are both pointed toward the ceiling and your feet are flat on the floor.  Extend your arms out to the side. This will stabilize your upper body by keeping your shoulders in contact with the  floor.  Gently and slowly drop both knees together to one side until you feel a gentle stretch in your low back. Hold for __________ seconds.  Tense your stomach muscles to support your low back as you bring your knees back to the starting position. Repeat the exercise to the other side. Repeat __________ times. Complete this exercise __________ times per day  EXTENSION RANGE OF MOTION AND FLEXIBILITY EXERCISES: STRETCH - Extension, Prone on Elbows  Lie on your stomach on the floor, a bed will be too soft. Place your palms about shoulder width apart and at the height of your head.  Place your elbows under your shoulders. If this is too painful, stack pillows under your chest.  Allow your body to relax so that your hips drop lower and make contact more completely with the floor.  Hold this position for __________ seconds.  Slowly return to lying flat on the floor. Repeat __________ times. Complete this exercise __________ times per day.  RANGE OF MOTION - Extension, Prone Press Ups  Lie on your stomach on the floor, a bed will be too soft. Place your palms about shoulder width apart and at the height of your head.  Keeping your back as relaxed as possible, slowly straighten your elbows while keeping your hips on the floor. You may adjust the placement of your hands to maximize your comfort. As you gain motion, your hands will come more underneath your shoulders.  Hold this position __________ seconds.  Slowly return to lying flat on the floor. Repeat __________ times. Complete this exercise __________ times per day.  STRENGTHENING EXERCISES - Sciatica  These exercises may help you when beginning to rehabilitate your injury. These exercises should be done near your "sweet spot." This is the neutral, low-back arch, somewhere between fully rounded and fully arched, that is your least painful position. When performed in this safe range of motion, these exercises can be used for people who  have either a flexion or extension based injury. These exercises may resolve your symptoms with or without further involvement from your physician, physical therapist or athletic trainer. While completing these exercises, remember:   Muscles can gain both the endurance and the strength needed for everyday activities through controlled exercises.  Complete these exercises as instructed by your physician, physical therapist or athletic trainer. Progress with the resistance and repetition exercises only as your caregiver advises.  You may experience muscle soreness or fatigue, but the pain or discomfort you are trying to eliminate should never worsen during these exercises. If this pain does worsen, stop and make certain you are following the directions exactly. If the pain is still present after adjustments, discontinue the exercise until you can discuss the trouble with your clinician. STRENGTHENING - Deep Abdominals, Pelvic Tilt   Lie on a firm bed or floor. Keeping your legs in front of you, bend your knees so they are both pointed toward the ceiling and your feet are flat on the floor.  Tense your lower abdominal muscles to press your low back into the floor. This motion will  rotate your pelvis so that your tail bone is scooping upwards rather than pointing at your feet or into the floor.  With a gentle tension and even breathing, hold this position for __________ seconds. Repeat __________ times. Complete this exercise __________ times per day.  STRENGTHENING - Abdominals, Crunches   Lie on a firm bed or floor. Keeping your legs in front of you, bend your knees so they are both pointed toward the ceiling and your feet are flat on the floor. Cross your arms over your chest.  Slightly tip your chin down without bending your neck.  Tense your abdominals and slowly lift your trunk high enough to just clear your shoulder blades. Lifting higher can put excessive stress on the low back and does not  further strengthen your abdominal muscles.  Control your return to the starting position. Repeat __________ times. Complete this exercise __________ times per day.  STRENGTHENING - Quadruped, Opposite UE/LE Lift  Assume a hands and knees position on a firm surface. Keep your hands under your shoulders and your knees under your hips. You may place padding under your knees for comfort.  Find your neutral spine and gently tense your abdominal muscles so that you can maintain this position. Your shoulders and hips should form a rectangle that is parallel with the floor and is not twisted.  Keeping your trunk steady, lift your right hand no higher than your shoulder and then your left leg no higher than your hip. Make sure you are not holding your breath. Hold this position __________ seconds.  Continuing to keep your abdominal muscles tense and your back steady, slowly return to your starting position. Repeat with the opposite arm and leg. Repeat __________ times. Complete this exercise __________ times per day.  STRENGTHENING - Abdominals and Quadriceps, Straight Leg Raise   Lie on a firm bed or floor with both legs extended in front of you.  Keeping one leg in contact with the floor, bend the other knee so that your foot can rest flat on the floor.  Find your neutral spine, and tense your abdominal muscles to maintain your spinal position throughout the exercise.  Slowly lift your straight leg off the floor about 6 inches for a count of 15, making sure to not hold your breath.  Still keeping your neutral spine, slowly lower your leg all the way to the floor. Repeat this exercise with each leg __________ times. Complete this exercise __________ times per day. POSTURE AND BODY MECHANICS CONSIDERATIONS - Sciatica Keeping correct posture when sitting, standing or completing your activities will reduce the stress put on different body tissues, allowing injured tissues a chance to heal and  limiting painful experiences. The following are general guidelines for improved posture. Your physician or physical therapist will provide you with any instructions specific to your needs. While reading these guidelines, remember:  The exercises prescribed by your provider will help you have the flexibility and strength to maintain correct postures.  The correct posture provides the optimal environment for your joints to work. All of your joints have less wear and tear when properly supported by a spine with good posture. This means you will experience a healthier, less painful body.  Correct posture must be practiced with all of your activities, especially prolonged sitting and standing. Correct posture is as important when doing repetitive low-stress activities (typing) as it is when doing a single heavy-load activity (lifting). RESTING POSITIONS Consider which positions are most painful for you when choosing a  resting position. If you have pain with flexion-based activities (sitting, bending, stooping, squatting), choose a position that allows you to rest in a less flexed posture. You would want to avoid curling into a fetal position on your side. If your pain worsens with extension-based activities (prolonged standing, working overhead), avoid resting in an extended position such as sleeping on your stomach. Most people will find more comfort when they rest with their spine in a more neutral position, neither too rounded nor too arched. Lying on a non-sagging bed on your side with a pillow between your knees, or on your back with a pillow under your knees will often provide some relief. Keep in mind, being in any one position for a prolonged period of time, no matter how correct your posture, can still lead to stiffness. PROPER SITTING POSTURE In order to minimize stress and discomfort on your spine, you must sit with correct posture Sitting with good posture should be effortless for a healthy body.  Returning to good posture is a gradual process. Many people can work toward this most comfortably by using various supports until they have the flexibility and strength to maintain this posture on their own. When sitting with proper posture, your ears will fall over your shoulders and your shoulders will fall over your hips. You should use the back of the chair to support your upper back. Your low back will be in a neutral position, just slightly arched. You may place a small pillow or folded towel at the base of your low back for support.  When working at a desk, create an environment that supports good, upright posture. Without extra support, muscles fatigue and lead to excessive strain on joints and other tissues. Keep these recommendations in mind: CHAIR:   A chair should be able to slide under your desk when your back makes contact with the back of the chair. This allows you to work closely.  The chair's height should allow your eyes to be level with the upper part of your monitor and your hands to be slightly lower than your elbows. BODY POSITION  Your feet should make contact with the floor. If this is not possible, use a foot rest.  Keep your ears over your shoulders. This will reduce stress on your neck and low back. INCORRECT SITTING POSTURES   If you are feeling tired and unable to assume a healthy sitting posture, do not slouch or slump. This puts excessive strain on your back tissues, causing more damage and pain. Healthier options include:  Using more support, like a lumbar pillow.  Switching tasks to something that requires you to be upright or walking.  Talking a brief walk.  Lying down to rest in a neutral-spine position. PROLONGED STANDING WHILE SLIGHTLY LEANING FORWARD  When completing a task that requires you to lean forward while standing in one place for a long time, place either foot up on a stationary 2-4 inch high object to help maintain the best posture. When both  feet are on the ground, the low back tends to lose its slight inward curve. If this curve flattens (or becomes too large), then the back and your other joints will experience too much stress, fatigue more quickly and can cause pain.  CORRECT STANDING POSTURES Proper standing posture should be assumed with all daily activities, even if they only take a few moments, like when brushing your teeth. As in sitting, your ears should fall over your shoulders and your shoulders should fall  over your hips. You should keep a slight tension in your abdominal muscles to brace your spine. Your tailbone should point down to the ground, not behind your body, resulting in an over-extended swayback posture.  INCORRECT STANDING POSTURES  Common incorrect standing postures include a forward head, locked knees and/or an excessive swayback. WALKING Walk with an upright posture. Your ears, shoulders and hips should all line-up. PROLONGED ACTIVITY IN A FLEXED POSITION When completing a task that requires you to bend forward at your waist or lean over a low surface, try to find a way to stabilize 3 of 4 of your limbs. You can place a hand or elbow on your thigh or rest a knee on the surface you are reaching across. This will provide you more stability so that your muscles do not fatigue as quickly. By keeping your knees relaxed, or slightly bent, you will also reduce stress across your low back. CORRECT LIFTING TECHNIQUES DO :   Assume a wide stance. This will provide you more stability and the opportunity to get as close as possible to the object which you are lifting.  Tense your abdominals to brace your spine; then bend at the knees and hips. Keeping your back locked in a neutral-spine position, lift using your leg muscles. Lift with your legs, keeping your back straight.  Test the weight of unknown objects before attempting to lift them.  Try to keep your elbows locked down at your sides in order get the best strength  from your shoulders when carrying an object.  Always ask for help when lifting heavy or awkward objects. INCORRECT LIFTING TECHNIQUES DO NOT:   Lock your knees when lifting, even if it is a small object.  Bend and twist. Pivot at your feet or move your feet when needing to change directions.  Assume that you cannot safely pick up a paperclip without proper posture. Document Released: 04/21/2005 Document Revised: 09/05/2013 Document Reviewed: 08/03/2008 [facility] Patient Information 2015 [place], [place]. This information is not intended to replace advice given to you by your health care provider. Make sure you discuss any questions you have with your health care provider.

## 2014-12-28 NOTE — ED Notes (Signed)
C/o back pain radiating down to legs States she was getting dressed today when pain started  Does have hx of back pain States pain is worst Denies any urinary problems Does stand for 7 hrs straight

## 2014-12-28 NOTE — ED Provider Notes (Signed)
CSN: [id number]     Arrival date & time 12/28/14  1441 History   First MD Initiated Contact with Patient 12/28/14 1524     Chief Complaint  Patient presents with  . Back Pain   (Consider location/radiation/quality/duration/timing/severity/associated sxs/prior Treatment) HPI   Lower back. L and right. Came on sudeenly after bending over. Started back to work after being out for 2 years. 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 Travels to [place]. Denies difficulty walking, loss of bowel or bladder function. Has not tried anything for the pain. OTC tylenol prior to episode. Constant. Worse w/ certain movements.   Past Medical History  Diagnosis Date  . Depression    Past Surgical History  Procedure Laterality Date  . Ankle surgery    . R ankle       unsure of why she had the surgery   Family History  Problem Relation Age of Onset  . Cancer Sister   . Diabetes Other   . Hypertension Other    Social History  Substance Use Topics  . Smoking status: Never Smoker   . Smokeless tobacco: None  . Alcohol Use: No   OB History    No data available     Review of Systems Per HPI with all other pertinent systems negative.   Allergies  Review of patient's allergies indicates no known allergies.  Home Medications   Prior to Admission medications   Medication Sig Start Date End Date Taking? Authorizing Provider  sertraline (ZOLOFT) 100 MG tablet Take 100 mg by mouth daily.   Yes Historical Provider, MD  diclofenac (VOLTAREN) 75 MG EC tablet Take 1 tablet (75 mg total) by mouth 2 (two) times daily. 12/28/14   Ozella Rocks, MD  methocarbamol (ROBAXIN) 500 MG tablet Take 1-2 tablets (500-1,000 mg total) by mouth every 6 (six) hours as needed for muscle spasms. 12/28/14   Ozella Rocks, MD  OVER THE COUNTER MEDICATION Take 500 mg by mouth daily as needed. Infinity  (weight loss pill)    Historical Provider, MD  predniSONE (DELTASONE) 50 MG tablet Take daily with breakfast  12/28/14   Ozella Rocks, MD   Meds Ordered and Administered this Visit   Medications  ketorolac (TORADOL) injection 60 mg (not administered)    BP 131/86 mmHg  Pulse 85  Temp(Src) 99.1 F (37.3 C) (Oral)  Resp 16  SpO2 97%  LMP 09/13/2013 No data found.   Physical Exam Physical Exam  Constitutional: oriented to person, place, and time. appears well-developed and well-nourished. No distress.  HENT:  Head: Normocephalic and atraumatic.  Eyes: EOMI. PERRL.  Neck: Normal range of motion.  Cardiovascular: RRR, no m/r/g, 2+ distal pulses,  Pulmonary/Chest: Effort normal and breath sounds normal. No respiratory distress.  Abdominal: Soft. Bowel sounds are normal. NonTTP, no distension.  Musculoskeletal: Intermittent lumbar soft tissue paraspinal tenderness to palpation. Patient moves lower extremities without difficulty and no appreciable weakness..  Neurological: alert and oriented to person, place, and time.  Skin: Skin is warm. No rash noted. non diaphoretic.  Psychiatric: normal mood and affect. behavior is normal. Judgment and thought content normal.   ED Course  Procedures (including critical care time)  Labs Review Labs Reviewed - No data to display  Imaging Review No results found.   Visual Acuity Review  Right Eye Distance:   Left Eye Distance:   Bilateral Distance:    Right Eye Near:   Left Eye Near:  Bilateral Near:         MDM   1. Sciatica, unspecified laterality   2. Back spasm      Toradol 60 mg IM in clinic. Start steroids. Start Robaxin. Exercise.  Ozella Rocks, MD 12/28/14 [phone]

## 2015-09-01 ENCOUNTER — Emergency Department (HOSPITAL_COMMUNITY)
Admission: EM | Admit: 2015-09-01 | Discharge: 2015-09-01 | Disposition: A | Discharge status: Home / Self Care | Payer: Self-pay | Attending: Emergency Medicine | Admitting: Emergency Medicine

## 2015-09-01 ENCOUNTER — Encounter (HOSPITAL_COMMUNITY): Payer: Self-pay

## 2015-09-01 DIAGNOSIS — Z8659 Personal history of other mental and behavioral disorders: Secondary | ICD-10-CM | POA: Insufficient documentation

## 2015-09-01 DIAGNOSIS — M79642 Pain in left hand: Secondary | ICD-10-CM | POA: Insufficient documentation

## 2015-09-01 DIAGNOSIS — M79672 Pain in left foot: Secondary | ICD-10-CM | POA: Insufficient documentation

## 2015-09-01 DIAGNOSIS — M79645 Pain in left finger(s): Secondary | ICD-10-CM

## 2015-09-01 MED ORDER — PREDNISONE 50 MG PO TABS: 50.0000 mg | ORAL_TABLET | Freq: Every day | ORAL | Status: DC

## 2015-09-01 MED ORDER — HYDROCODONE-ACETAMINOPHEN 5-325 MG PO TABS: 1.0000 | ORAL_TABLET | ORAL | Status: DC | PRN

## 2015-09-01 NOTE — ED Notes (Signed)
Pt. Coming from home c/o left foot pain and left hand middle finger pain. Pt. sts pain in her foot is so bad she was not able to walk to the bathroom today and urinated on herself. Pt. Describes the pain as throbbing 10/10 pain. Swelling noted to left middle digit. Pt. Denies any injury.

## 2015-09-01 NOTE — ED Notes (Signed)
Pt. sts she is upset that the doctor did nothing for her. She sts that he did not examine her finger. Made EDP aware.

## 2015-09-01 NOTE — ED Notes (Signed)
Dr. Freida BusmanAllen speaking with patient at this time. EDP explaining finger examination and that an Xray is not indicated in her case. Pt. Agrees that EDP did examine her and sts understanding at this time.

## 2015-09-01 NOTE — ED Provider Notes (Addendum)
CSN: [id number]     Arrival date & time 09/01/15  0609 History   First MD Initiated Contact with Patient 09/01/15 [phone]     Chief Complaint  Patient presents with  . Finger Injury  . Foot Injury     (Consider location/radiation/quality/duration/timing/severity/associated sxs/prior Treatment) HPI Comments: Patient here complaining of left heel pain as well as left middle finger pain times several days. Denies any fever or chills. Denies any trauma to her joints. No rashes appreciated. Pain characterized as sharp and worse with movement and better with rest. Has used ibuprofen without relief. Denies any other joint pain. Denies a history of gout.  Patient is a 45 y.o. female presenting with foot injury. The history is provided by the patient.  Foot Injury   Past Medical History  Diagnosis Date  . Depression    Past Surgical History  Procedure Laterality Date  . Ankle surgery    . R ankle       unsure of why she had the surgery   Family History  Problem Relation Age of Onset  . Cancer Sister   . Diabetes Other   . Hypertension Other    Social History  Substance Use Topics  . Smoking status: Never Smoker   . Smokeless tobacco: None  . Alcohol Use: Yes     Comment: occasional    OB History    No data available     Review of Systems  All other systems reviewed and are negative.     Allergies  Review of patient's allergies indicates no known allergies.  Home Medications   Prior to Admission medications   Medication Sig Start Date End Date Taking? Authorizing Provider  ibuprofen (ADVIL,MOTRIN) 200 MG tablet Take 400-600 mg by mouth every 6 (six) hours as needed for moderate pain.   Yes Historical Provider, MD   BP 125/78 mmHg  Pulse 85  Temp(Src) 98.4 F (36.9 C) (Oral)  Resp 24  Ht 6\' 2"  (1.88 m)  Wt 136.079 kg  BMI 38.50 kg/m2  SpO2 98%  LMP 08/17/2015 Physical Exam  Constitutional: She is oriented to person, place, and time. She appears well-developed  and well-nourished.  Non-toxic appearance. No distress.  HENT:  Head: Normocephalic and atraumatic.  Eyes: Conjunctivae, EOM and lids are normal. Pupils are equal, round, and reactive to light.  Neck: Normal range of motion. Neck supple. No tracheal deviation present. No thyroid mass present.  Cardiovascular: Normal rate, regular rhythm and normal heart sounds.  Exam reveals no gallop.   No murmur heard. Pulmonary/Chest: Effort normal and breath sounds normal. No stridor. No respiratory distress. She has no decreased breath sounds. She has no wheezes. She has no rhonchi. She has no rales.  Abdominal: Soft. Normal appearance and bowel sounds are normal. She exhibits no distension. There is no tenderness. There is no rebound and no CVA tenderness.  Musculoskeletal: Normal range of motion. She exhibits no edema or tenderness.       Hands:      Feet:  Neurological: She is alert and oriented to person, place, and time. She has normal strength. No cranial nerve deficit or sensory deficit. GCS eye subscore is 4. GCS verbal subscore is 5. GCS motor subscore is 6.  Skin: Skin is warm and dry. No abrasion and no rash noted.  Psychiatric: She has a normal mood and affect. Her speech is normal and behavior is normal.  Nursing note and vitals reviewed.   ED Course  Procedures (including critical  care time) Labs Review Labs Reviewed - No data to display  Imaging Review No results found. I have personally reviewed and evaluated these images and lab results as part of my medical decision-making.   EKG Interpretation None      MDM   Final diagnoses:  None    Patient without evidence of septic joint. She is atraumatic left heel and left middle finger pain. Will not perform exercises at this time suspect she has a inflammatory joint condition will place on prednisone as well as given analgesics.  7:59 AM Plan back and spoke with patient due to concerns about her current ED evaluation. I  explained to her that prednisone would likely help her pain as well as the opiates prescribed. I explained to her that I examined her left middle finger and left heel appropriately and that there were no indications such as trauma or infection for your bloodwork imaging at this time. Patient was upset with this and encouraged her to take the medication at was prescribed for her and she was given return precautions  Lorre Nick, MD 09/01/15 0745  Lorre Nick, MD 09/01/15 0800

## 2015-09-01 NOTE — Discharge Instructions (Signed)

## 2015-09-07 ENCOUNTER — Encounter (HOSPITAL_COMMUNITY): Payer: Self-pay | Admitting: Emergency Medicine

## 2015-09-07 ENCOUNTER — Ambulatory Visit (HOSPITAL_COMMUNITY)
Admission: EM | Admit: 2015-09-07 | Discharge: 2015-09-07 | Disposition: A | Discharge status: Home / Self Care | Payer: Self-pay | Attending: Emergency Medicine | Admitting: Emergency Medicine

## 2015-09-07 DIAGNOSIS — S61002A Unspecified open wound of left thumb without damage to nail, initial encounter: Secondary | ICD-10-CM

## 2015-09-07 DIAGNOSIS — S61012A Laceration without foreign body of left thumb without damage to nail, initial encounter: Secondary | ICD-10-CM

## 2015-09-07 DIAGNOSIS — Z23 Encounter for immunization: Secondary | ICD-10-CM

## 2015-09-07 MED ORDER — TETANUS-DIPHTH-ACELL PERTUSSIS 5-2.5-18.5 LF-MCG/0.5 IM SUSP
0.5000 mL | Freq: Once | INTRAMUSCULAR | Status: AC
  Administered 2015-09-07: 0.5 mL via INTRAMUSCULAR

## 2015-09-07 MED ORDER — TETANUS-DIPHTH-ACELL PERTUSSIS 5-2.5-18.5 LF-MCG/0.5 IM SUSP
INTRAMUSCULAR | Status: AC
  Filled 2015-09-07: qty 0.5

## 2015-09-07 MED ORDER — LIDOCAINE-EPINEPHRINE (PF) 2 %-1:200000 IJ SOLN
INTRAMUSCULAR | Status: AC
  Filled 2015-09-07: qty 20
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
## 2015-09-07 NOTE — Discharge Instructions (Signed)
Stitches, Staples, or Adhesive Wound Closure Health care providers use stitches (sutures), staples, and certain glue (skin adhesives) to hold skin together while it heals (wound closure). You may need this treatment after you have surgery or if you cut your skin accidentally. These methods help your skin to heal more quickly and make it less likely that you will have a scar. A wound may take several months to heal completely. The type of wound you have determines when your wound gets closed. In most cases, the wound is closed as soon as possible (primary skin closure). Sometimes, closure is delayed so the wound can be cleaned and allowed to heal naturally. This reduces the chance of infection. Delayed closure may be needed if your wound:  Is caused by a bite.  Happened more than 6 hours ago.  Involves loss of skin or the tissues under the skin.  Has dirt or debris in it that cannot be removed.  Is infected. WHAT ARE THE DIFFERENT KINDS OF WOUND CLOSURES? There are many options for wound closure. The one that your health care provider uses depends on how deep and how large your wound is. Sutures Sutures are the oldest method of wound closure. Sutures can be made from natural substances, such as silk, or from synthetic materials, such as nylon and steel. They can be made from a material that your body can break down as your wound heals (absorbable), or they can be made from a material that needs to be removed from your skin (nonabsorbable). They come in many different strengths and sizes. Your health care provider attaches the sutures to a steel needle on one end. Sutures can be passed through your skin, or through the tissues beneath your skin. Then they are tied and cut. Your skin edges may be closed in one continuous stitch or in separate stitches. Sutures are strong and can be used for all kinds of wounds. Absorbable sutures may be used to close tissues under the skin. The disadvantage of sutures  is that they may cause skin reactions that lead to infection. Nonabsorbable sutures need to be removed. HOW DO I CARE FOR MY WOUND CLOSURE?  Take medicines only as directed by your health care provider.  If you were prescribed an antibiotic medicine for your wound, finish it all even if you start to feel better.  Use ointments or creams only as directed by your health care provider.  Wash your hands with soap and water before and after touching your wound.  Do not soak your wound in water. Do not take baths, swim, or use a hot tub until your health care provider approves.  Ask your health care provider when you can start showering. Cover your wound if directed by your health care provider.  Do not take out your own sutures or staples.  Do not pick at your wound. Picking can cause an infection.  Keep all follow-up visits as directed by your health care provider. This is important. HOW LONG WILL I HAVE MY WOUND CLOSURE?  Leave adhesive glue on your skin until the glue peels away.  Leave adhesive strips on your skin until the strips fall off.  Absorbable sutures will dissolve within several days.  Nonabsorbable sutures and staples must be removed. The location of the wound will determine how long they stay in. This can range from several days to a couple of weeks. WHEN SHOULD I SEEK HELP FOR MY WOUND CLOSURE? Contact your health care provider if:  You have  a fever.  You have chills.  You have drainage, redness, swelling, or pain at your wound.  There is a bad smell coming from your wound.  The skin edges of your wound start to separate after your sutures have been removed.  Your wound becomes thick, raised, and darker in color after your sutures come out (scarring).   This information is not intended to replace advice given to you by your health care provider. Make sure you discuss any questions you have with your health care provider.   Document Released: 01/14/2001  Document Revised: 05/12/2014 Document Reviewed: 09/28/2013 Elsevier Interactive Patient Education [employer]

## 2015-09-19 ENCOUNTER — Ambulatory Visit (HOSPITAL_COMMUNITY)
Admission: EM | Admit: 2015-09-19 | Discharge: 2015-09-19 | Disposition: A | Discharge status: Home / Self Care | Payer: Self-pay | Attending: Emergency Medicine | Admitting: Emergency Medicine

## 2015-09-19 ENCOUNTER — Encounter (HOSPITAL_COMMUNITY): Payer: Self-pay | Admitting: Emergency Medicine

## 2015-09-19 DIAGNOSIS — Z4802 Encounter for removal of sutures: Secondary | ICD-10-CM

## 2015-09-19 NOTE — ED Provider Notes (Signed)
CSN: [id number]     Arrival date & time 09/19/15  1456 History   First MD Initiated Contact with Patient 09/19/15 1615     Chief Complaint  Patient presents with  . Suture / Staple Removal   (Consider location/radiation/quality/duration/timing/severity/associated sxs/prior Treatment) HPI  She is a 45 year old woman here for suture removal. Sutures were replaced here on May 5. She has 3 stitches in the left thumb. She denies any redness or drainage. She states she does have a pulling sensation at times.    Past Medical History  Diagnosis Date  . Depression    Past Surgical History  Procedure Laterality Date  . Ankle surgery    . R ankle       unsure of why she had the surgery   Family History  Problem Relation Age of Onset  . Cancer Sister   . Diabetes Other   . Hypertension Other    Social History  Substance Use Topics  . Smoking status: Never Smoker   . Smokeless tobacco: None  . Alcohol Use: Yes     Comment: occasional    OB History    No data available     Review of Systems As in history of present illness Allergies  Review of patient's allergies indicates no known allergies.  Home Medications   Prior to Admission medications   Medication Sig Start Date End Date Taking? Authorizing Provider  HYDROcodone-acetaminophen (NORCO/VICODIN) 5-325 MG tablet Take 1-2 tablets by mouth every 4 (four) hours as needed. 09/01/15  Yes Lorre NickAnthony Allen, MD  ibuprofen (ADVIL,MOTRIN) 200 MG tablet Take 400-600 mg by mouth every 6 (six) hours as needed for moderate pain.   Yes Historical Provider, MD  predniSONE (DELTASONE) 50 MG tablet Take 1 tablet (50 mg total) by mouth daily. 09/01/15   Lorre NickAnthony Allen, MD   Meds Ordered and Administered this Visit  Medications - No data to display  BP 149/105 mmHg  Pulse 73  Temp(Src) 97.9 F (36.6 C) (Oral)  Resp 18  SpO2 100%  LMP 08/03/2015 (Within Days) No data found.   Physical Exam  Constitutional: She is oriented to person,  place, and time. She appears well-developed and well-nourished. No distress.  Cardiovascular: Normal rate.   Pulmonary/Chest: Effort normal.  Neurological: She is alert and oriented to person, place, and time.  Skin:  Well-healed laceration to left thumb with 3 sutures visualized. No erythema or drainage.    ED Course  .Suture Removal Date/Time: 09/19/2015 4:38 PM Performed by: Charm RingsHONIG, Chaitra Mast J Authorized by: Charm RingsHONIG, Kandance Yano J Consent: Verbal consent obtained. Risks and benefits: risks, benefits and alternatives were discussed Consent given by: patient Patient understanding: patient states understanding of the procedure being performed Patient identity confirmed: verbally with patient Time out: Immediately prior to procedure a "time out" was called to verify the correct patient, procedure, equipment, support staff and site/side marked as required. Body area: upper extremity Location details: left thumb Wound Appearance: clean Sutures Removed: 3 Facility: sutures placed in this facility Patient tolerance: Patient tolerated the procedure well with no immediate complications   (including critical care time)  Labs Review Labs Reviewed - No data to display  Imaging Review No results found.   MDM   1. Visit for suture removal    Sutures removed. Wound care discussed. Follow-up as needed.    Charm RingsErin J Caydin Yeatts, MD 09/19/15 [phone]

## 2015-09-19 NOTE — ED Notes (Signed)
Pt is here today to have stitches removed from her left thumb.  Pt reports some pain in the thumb today.  The wound is clean, dry, intact, with no redness or swelling.

## 2015-09-19 NOTE — Discharge Instructions (Signed)
The wound has healed well. You will likely have some lingering discomfort as the scar tissue remodels. Tylenol, ibuprofen, and heat will help ease the discomfort. Follow-up as needed.

## 2016-01-25 ENCOUNTER — Ambulatory Visit (HOSPITAL_COMMUNITY)
Admission: EM | Admit: 2016-01-25 | Discharge: 2016-01-25 | Disposition: A | Discharge status: Home / Self Care | Payer: Self-pay | Attending: Family Medicine | Admitting: Family Medicine

## 2016-01-25 ENCOUNTER — Encounter (HOSPITAL_COMMUNITY): Payer: Self-pay | Admitting: Emergency Medicine

## 2016-01-25 DIAGNOSIS — J069 Acute upper respiratory infection, unspecified: Secondary | ICD-10-CM

## 2016-01-25 DIAGNOSIS — J029 Acute pharyngitis, unspecified: Secondary | ICD-10-CM

## 2016-01-25 MED ORDER — PREDNISONE 50 MG PO TABS: 50.0000 mg | ORAL_TABLET | Freq: Every day | ORAL | 0 refills | Status: DC

## 2016-01-25 MED ORDER — AMOXICILLIN 875 MG PO TABS: 875.0000 mg | ORAL_TABLET | Freq: Two times a day (BID) | ORAL | 0 refills | Status: DC

## 2016-01-25 NOTE — ED Provider Notes (Signed)
[facility]    CSN: [id number] Arrival date & time: 01/25/16  4098  First Provider Contact:  First MD Initiated Contact with Patient 01/25/16 1010        History   Chief Complaint Chief Complaint  Patient presents with  . URI    HPI Tracyann Duffell is a 45 y.o. female.   This 45 year old unemployed woman who comes in with progressive sore throat over 3 days. She has developed some ear pain and diffuse aching as well as over the last couple days. She's not able to sleep easily because of the pain.  She's taken ibuprofen.      Past Medical History:  Diagnosis Date  . Depression     There are no active problems to display for this patient.   Past Surgical History:  Procedure Laterality Date  . ANKLE SURGERY    . R ankle      unsure of why she had the surgery    OB History    No data available       Home Medications    Prior to Admission medications   Medication Sig Start Date End Date Taking? Authorizing Provider  amoxicillin (AMOXIL) 875 MG tablet Take 1 tablet (875 mg total) by mouth 2 (two) times daily. 01/25/16   Elvina Sidle, MD  ibuprofen (ADVIL,MOTRIN) 200 MG tablet Take 400-600 mg by mouth every 6 (six) hours as needed for moderate pain.    Historical Provider, MD  predniSONE (DELTASONE) 50 MG tablet Take 1 tablet (50 mg total) by mouth daily. 01/25/16   Elvina Sidle, MD    Family History Family History  Problem Relation Age of Onset  . Cancer Sister   . Diabetes Other   . Hypertension Other     Social History Social History  Substance Use Topics  . Smoking status: Never Smoker  . Smokeless tobacco: Never Used  . Alcohol use Yes     Comment: occasional      Allergies   Review of patient's allergies indicates no known allergies.   Review of Systems Review of Systems  Constitutional: Positive for chills and fatigue.  HENT: Positive for congestion and sore throat.   Eyes: Negative.   Respiratory: Positive for  cough.   Cardiovascular: Negative.   Gastrointestinal: Negative.      Physical Exam Triage Vital Signs ED Triage Vitals  Enc Vitals Group     BP      Pulse      Resp      Temp      Temp src      SpO2      Weight      Height      Head Circumference      Peak Flow      Pain Score      Pain Loc      Pain Edu?      Excl. in GC?    No data found.   Updated Vital Signs BP 125/82 (BP Location: Left Arm)   Pulse 94   Temp 98.8 F (37.1 C) (Oral)   Resp 20   SpO2 100%      Physical Exam  Constitutional: She appears well-developed and well-nourished.  HENT:  Head: Normocephalic.  Right Ear: External ear normal.  Left Ear: External ear normal.  Nose: Nose normal.  Eyes: Conjunctivae are normal. Pupils are equal, round, and reactive to light.  Neck: Normal range of motion. Neck supple.  Cardiovascular: Normal rate, regular  rhythm and normal heart sounds.   Pulmonary/Chest: Effort normal and breath sounds normal.  Nursing note and vitals reviewed.    UC Treatments / Results  Labs (all labs ordered are listed, but only abnormal results are displayed) Labs Reviewed - No data to display  EKG  EKG Interpretation None       Radiology No results found.  Procedures Procedures (including critical care time)  Medications Ordered in UC Medications - No data to display   Initial Impression / Assessment and Plan / UC Course  I have reviewed the triage vital signs and the nursing notes.  Pertinent labs & imaging results that were available during my care of the patient were reviewed by me and considered in my medical decision making (see chart for details).  Clinical Course      Final Clinical Impressions(s) / UC Diagnoses   Final diagnoses:  URI (upper respiratory infection)  Sore throat    New Prescriptions New Prescriptions   AMOXICILLIN (AMOXIL) 875 MG TABLET    Take 1 tablet (875 mg total) by mouth 2 (two) times daily.     Elvina SidleKurt Dail Lerew,  MD 01/25/16 1019

## 2016-01-25 NOTE — ED Triage Notes (Signed)
Pt is here for cold sx onset 3 days associated w/ST, runny nose, bilateral ear pain, BA  Taking OTC meds w/no relief.   A&O x4... AND

## 2016-02-26 ENCOUNTER — Other Ambulatory Visit (HOSPITAL_COMMUNITY): Payer: Self-pay | Admitting: *Deleted

## 2016-02-26 DIAGNOSIS — N6320 Unspecified lump in the left breast, unspecified quadrant: Secondary | ICD-10-CM

## 2016-04-03 ENCOUNTER — Ambulatory Visit
Admission: RE | Admit: 2016-04-03 | Discharge: 2016-04-03 | Disposition: A | Discharge status: Home / Self Care | Payer: No Typology Code available for payment source | Source: Ambulatory Visit | Attending: Obstetrics and Gynecology | Admitting: Obstetrics and Gynecology

## 2016-04-03 ENCOUNTER — Encounter (HOSPITAL_COMMUNITY): Payer: Self-pay

## 2016-04-03 ENCOUNTER — Ambulatory Visit (HOSPITAL_COMMUNITY)
Admission: RE | Admit: 2016-04-03 | Discharge: 2016-04-03 | Disposition: A | Discharge status: Home / Self Care | Payer: Self-pay | Source: Ambulatory Visit | Attending: Obstetrics and Gynecology | Admitting: Obstetrics and Gynecology

## 2016-04-03 VITALS — BP 128/80 | Temp 98.2°F | Ht 74.0 in | Wt 274.2 lb

## 2016-04-03 DIAGNOSIS — N6320 Unspecified lump in the left breast, unspecified quadrant: Secondary | ICD-10-CM

## 2016-04-03 DIAGNOSIS — Z1239 Encounter for other screening for malignant neoplasm of breast: Secondary | ICD-10-CM

## 2016-04-03 DIAGNOSIS — N6322 Unspecified lump in the left breast, upper inner quadrant: Secondary | ICD-10-CM

## 2016-04-03 HISTORY — DX: Anxiety disorder, unspecified: F41.9

## 2016-04-03 NOTE — Progress Notes (Signed)
Complaints of left breast lump x 1 year that per patient has increased in size. Patient complained of left breast pain x 6 months that comes and goes. Patient rates pain at a 10 out of 10.  Pap Smear: Pap smear not completed today. Last Pap smear was 06/08/2012 at [facility] and normal with negative HPV. Per patient has no history of an abnormal Pap smear. Last Pap smear result is in EPIC.  Physical exam: Breasts Breasts symmetrical. No skin abnormalities bilateral breasts. No nipple retraction bilateral breasts. No nipple discharge bilateral breasts. No lymphadenopathy. No lumps palpated right breast. Palpated two pea sized lumps within the left breast at 10 o'clock 12 cm from the nipple and 11 o'clock 13 cm from the nipple. Complaints of tenderness when palpated lumps. [facility] and possible left breast ultrasound. Appointment scheduled for Thursday, April 03, 2016 at 1500.        Pelvic/Bimanual No Pap smear completed today since last Pap smear and HPV typing was completed 06/18/2012. Pap smear not indicated per BCCCP guidelines.   Smoking History: Patient has never smoked.  Patient Navigation: Patient education provided. [facility].

## 2016-04-03 NOTE — Patient Instructions (Signed)
Explained breast self awareness to [name]. Patient did not need a Pap smear today due to last Pap smear and HPV typing was 06/08/2012. Let her know BCCCP will cover Pap smears and HPV typing every 5 years unless has a history of abnormal Pap smears. [facility] and possible left breast ultrasound. Appointment scheduled for Thursday, April 03, 2016 at 1500. [name] verbalized understanding.  Zawadi Aplin, Kathaleen Maserhristine Poll, RN 3:51 PM

## 2016-04-04 ENCOUNTER — Encounter (HOSPITAL_COMMUNITY): Payer: Self-pay | Admitting: *Deleted

## 2016-11-08 ENCOUNTER — Encounter (HOSPITAL_COMMUNITY): Payer: Self-pay | Admitting: Emergency Medicine

## 2016-11-08 ENCOUNTER — Emergency Department (HOSPITAL_COMMUNITY): Payer: No Typology Code available for payment source

## 2016-11-08 ENCOUNTER — Emergency Department (HOSPITAL_COMMUNITY)
Admission: EM | Admit: 2016-11-08 | Discharge: 2016-11-08 | Disposition: A | Discharge status: Home / Self Care | Payer: No Typology Code available for payment source | Attending: Emergency Medicine | Admitting: Emergency Medicine

## 2016-11-08 ENCOUNTER — Other Ambulatory Visit: Payer: Self-pay

## 2016-11-08 DIAGNOSIS — R079 Chest pain, unspecified: Secondary | ICD-10-CM

## 2016-11-08 DIAGNOSIS — K219 Gastro-esophageal reflux disease without esophagitis: Secondary | ICD-10-CM | POA: Insufficient documentation

## 2016-11-08 DIAGNOSIS — R0602 Shortness of breath: Secondary | ICD-10-CM | POA: Insufficient documentation

## 2016-11-08 HISTORY — DX: Bipolar disorder, unspecified: F31.9

## 2016-11-08 LAB — CBC
HCT: 34.5 % — ABNORMAL LOW (ref 36.0–46.0)
Hemoglobin: 10.8 g/dL — ABNORMAL LOW (ref 12.0–15.0)
MCH: 22.5 pg — ABNORMAL LOW (ref 26.0–34.0)
MCHC: 31.3 g/dL (ref 30.0–36.0)
MCV: 71.7 fL — ABNORMAL LOW (ref 78.0–100.0)
Platelets: 335 10*3/uL (ref 150–400)
RBC: 4.81 MIL/uL (ref 3.87–5.11)
RDW: 18.6 % — AB (ref 11.5–15.5)
WBC: 7.1 10*3/uL (ref 4.0–10.5)

## 2016-11-08 LAB — I-STAT TROPONIN, ED: TROPONIN I, POC: 0 ng/mL (ref 0.00–0.08)

## 2016-11-08 LAB — BASIC METABOLIC PANEL
Anion gap: 8 (ref 5–15)
BUN: 16 mg/dL (ref 6–20)
CO2: 22 mmol/L (ref 22–32)
Calcium: 10.2 mg/dL (ref 8.9–10.3)
Chloride: 104 mmol/L (ref 101–111)
Creatinine, Ser: 0.67 mg/dL (ref 0.44–1.00)
GFR calc Af Amer: >60 mL/min (ref >60)
GFR calc non Af Amer: >60 mL/min (ref >60)
GLUCOSE: 97 mg/dL (ref 65–99)
Potassium: 4.1 mmol/L (ref 3.5–5.1)
Sodium: 134 mmol/L — ABNORMAL LOW (ref 135–145)

## 2016-11-08 MED ORDER — HYDROCODONE-ACETAMINOPHEN 5-325 MG PO TABS
1.0000 | ORAL_TABLET | Freq: Once | ORAL | Status: AC
  Administered 2016-11-08: 1 via ORAL
  Filled 2016-11-08: qty 1

## 2016-11-08 MED ORDER — GI COCKTAIL ~~LOC~~
30.0000 mL | Freq: Once | ORAL | Status: AC
  Administered 2016-11-08: 30 mL via ORAL
  Filled 2016-11-08: qty 30

## 2016-11-08 MED ORDER — OMEPRAZOLE 20 MG PO CPDR: 20.0000 mg | DELAYED_RELEASE_CAPSULE | Freq: Every day | ORAL | 0 refills | Status: DC

## 2016-11-08 NOTE — ED Notes (Signed)
Pt verbalized understanding discharge instructions and denies any further needs or questions at this time. VS stable, ambulatory and steady gait.   

## 2016-11-08 NOTE — ED Provider Notes (Signed)
[facility] Provider Note   CSN: [id number] Arrival date & time: 11/08/16  0003     History   Chief Complaint Chief Complaint  Patient presents with  . Chest Pain    HPI [name] is a 46 y.o. female.  Patient presents for evaluation of central chest pain that started at home around 6:00 pm tonight (11/07/16). It has been constant since onset. She reports lying back makes the pain worse. No cough or fever. The pain seems to radiate to anterior neck. She reports SOB at first but none now. She has no significant medical history. Pain continues to be present.    The history is provided by the patient. No language interpreter was used.  Chest Pain   Associated symptoms include shortness of breath. Pertinent negatives include no diaphoresis, no fever, no nausea and no vomiting.    Past Medical History:  Diagnosis Date  . Anxiety   . Bipolar 1 disorder (HCC)   . Depression     There are no active problems to display for this patient.   Past Surgical History:  Procedure Laterality Date  . ANKLE SURGERY    . R ankle      unsure of why she had the surgery    OB History    Gravida Para Term Preterm AB Living   0 0 0 0 0 0   SAB TAB Ectopic Multiple Live Births   0 0 0 0 0       Home Medications    Prior to Admission medications   Medication Sig Start Date End Date Taking? Authorizing Provider  amoxicillin (AMOXIL) 875 MG tablet Take 1 tablet (875 mg total) by mouth 2 (two) times daily. Patient not taking: Reported on 04/03/2016 01/25/16   Elvina Sidle, MD  ibuprofen (ADVIL,MOTRIN) 200 MG tablet Take 400-600 mg by mouth every 6 (six) hours as needed for moderate pain.    [provider]  predniSONE (DELTASONE) 50 MG tablet Take 1 tablet (50 mg total) by mouth daily. Patient not taking: Reported on 04/03/2016 01/25/16   Elvina Sidle, MD    Family History Family History  Problem Relation Age of Onset  . Breast cancer Sister   .  Hypertension Sister   . Diabetes Other   . Hypertension Other   . Diabetes Mother   . Hypertension Mother   . Diabetes Father   . Hypertension Father   . Hypertension Brother   . Hypertension Sister     Social History Social History  Substance Use Topics  . Smoking status: Never Smoker  . Smokeless tobacco: Never Used  . Alcohol use Yes     Comment: occasional      Allergies   Patient has no known allergies.   Review of Systems Review of Systems  Constitutional: Negative for chills, diaphoresis and fever.  HENT: Negative.   Respiratory: Positive for shortness of breath.   Cardiovascular: Positive for chest pain.  Gastrointestinal: Negative.  Negative for nausea and vomiting.  Musculoskeletal: Negative.   Skin: Negative.   Neurological: Negative.      Physical Exam Updated Vital Signs BP (!) 154/94   Pulse 70   Temp 98.4 F (36.9 C) (Oral)   Resp 16   Ht 6\' 2"  (1.88 m)   Wt 136.1 kg (300 lb)   LMP 10/11/2016 (Approximate)   SpO2 100%   BMI 38.52 kg/m   Physical Exam  Constitutional: She is oriented to person, place, and time. She appears well-developed  and well-nourished.  HENT:  Head: Normocephalic.  Neck: Normal range of motion. Neck supple.  Cardiovascular: Normal rate and regular rhythm.   No murmur heard. Pulmonary/Chest: Effort normal and breath sounds normal. She has no wheezes. She has no rales. She exhibits no tenderness.  Abdominal: Soft. Bowel sounds are normal. There is no tenderness. There is no rebound and no guarding.  Musculoskeletal: Normal range of motion. She exhibits no edema.  Neurological: She is alert and oriented to person, place, and time.  Skin: Skin is warm and dry. No rash noted.  Psychiatric: She has a normal mood and affect.     ED Treatments / Results  Labs (all labs ordered are listed, but only abnormal results are displayed) Labs Reviewed  BASIC METABOLIC PANEL - Abnormal; Notable for the following:       Result  Value   Sodium 134 (*)    All other components within normal limits  CBC - Abnormal; Notable for the following:    Hemoglobin 10.8 (*)    HCT 34.5 (*)    MCV 71.7 (*)    MCH 22.5 (*)    RDW 18.6 (*)    All other components within normal limits  I-STAT TROPOININ, ED    EKG  EKG Interpretation  Date/Time:  Saturday November 08 2016 00:06:57 EDT Ventricular Rate:  79 PR Interval:  166 QRS Duration: 82 QT Interval:  372 QTC Calculation: 426 R Axis:   60 Text Interpretation:  Normal sinus rhythm Nonspecific ST and T wave abnormality Abnormal ECG No previous ECGs available Confirmed by Zadie RhineWickline, Donald ([id number]) on 11/08/2016 1:24:26 AM       Radiology Dg Chest 2 View  Result Date: 11/08/2016 CLINICAL DATA:  Acute onset of mid chest pain and shortness of breath. High blood pressure. Initial encounter. EXAM: CHEST  2 VIEW COMPARISON:  Chest radiograph performed 02/02/2007 FINDINGS: The lungs are well-aerated and clear. There is no evidence of focal opacification, pleural effusion or pneumothorax. The heart is normal in size; the mediastinal contour is within normal limits. No acute osseous abnormalities are seen. IMPRESSION: No acute cardiopulmonary process seen. Electronically Signed   By: Roanna RaiderJeffery  Chang M.D.   On: 11/08/2016 00:39    Procedures Procedures (including critical care time)  Medications Ordered in ED Medications  gi cocktail (Maalox,Lidocaine,Donnatal) (30 mLs Oral Given 11/08/16 0139)     Initial Impression / Assessment and Plan / ED Course  I have reviewed the triage vital signs and the nursing notes.  Pertinent labs & imaging results that were available during my care of the patient were reviewed by me and considered in my medical decision making (see chart for details).     Patient here for chest pain that started about 6 hours prior to arrival. She describes central chest pain that goes to throat. Lying back makes it worse. Pain is atypical for cardiac chest pain.  Troponin is negative, EKG NSR.   GI cocktail provided for symptom relief. She is feeling some better. She is felt stable for discharge home. Will start on PPI and encourage PCP follow up.   Final Clinical Impressions(s) / ED Diagnoses   Final diagnoses:  None   1. Nonspecific chest pain 2. Reflux  New Prescriptions New Prescriptions   No medications on file     Danne HarborUpstill, Greggory Safranek, PA-C 11/17/16 [id number]    Zadie RhineWickline, Donald, MD 11/18/16 2008

## 2016-11-08 NOTE — ED Triage Notes (Signed)
Pt reports central CP X past few hours with SOB. States pain radiates to neck, tight in nature, 5/10. Ambulatory.

## 2017-03-20 ENCOUNTER — Other Ambulatory Visit (HOSPITAL_COMMUNITY): Payer: Self-pay | Admitting: *Deleted

## 2017-03-20 DIAGNOSIS — N644 Mastodynia: Secondary | ICD-10-CM

## 2017-04-14 ENCOUNTER — Ambulatory Visit (HOSPITAL_COMMUNITY): Payer: No Typology Code available for payment source

## 2017-04-16 ENCOUNTER — Encounter (HOSPITAL_COMMUNITY): Payer: Self-pay

## 2017-04-16 ENCOUNTER — Ambulatory Visit
Admission: RE | Admit: 2017-04-16 | Discharge: 2017-04-16 | Disposition: A | Discharge status: Home / Self Care | Payer: No Typology Code available for payment source | Source: Ambulatory Visit | Attending: Obstetrics and Gynecology | Admitting: Obstetrics and Gynecology

## 2017-04-16 ENCOUNTER — Other Ambulatory Visit (HOSPITAL_COMMUNITY): Payer: Self-pay | Admitting: Obstetrics and Gynecology

## 2017-04-16 ENCOUNTER — Ambulatory Visit (HOSPITAL_COMMUNITY)
Admission: RE | Admit: 2017-04-16 | Discharge: 2017-04-16 | Disposition: A | Discharge status: Home / Self Care | Payer: Self-pay | Source: Ambulatory Visit | Attending: Obstetrics and Gynecology | Admitting: Obstetrics and Gynecology

## 2017-04-16 VITALS — BP 130/80 | Temp 98.6°F | Ht 74.0 in | Wt 297.6 lb

## 2017-04-16 DIAGNOSIS — N6320 Unspecified lump in the left breast, unspecified quadrant: Secondary | ICD-10-CM

## 2017-04-16 DIAGNOSIS — N644 Mastodynia: Secondary | ICD-10-CM

## 2017-04-16 DIAGNOSIS — Z1239 Encounter for other screening for malignant neoplasm of breast: Secondary | ICD-10-CM

## 2017-04-16 NOTE — Patient Instructions (Signed)
Explained breast self awareness with [name]. Patient did not need a Pap smear today due to last Pap smear and HPV typing was 06/08/2012. Let her know [facility] will cover Pap smears and HPV typing every 5 years unless has a history of abnormal Pap smears. Patients next Pap smear and HPV typing is due in February 2019. [facility]. [facility] and possible bilateral breast ultrasounds. Appointment scheduled for Thursday, April 16, 2017 at 1520. Patient aware of appointment and will be there. [place] Skel[place] verbalized understanding.  Johannah Rozas, Kathaleen Maserhristine Poll, RN 1:13 PM

## 2017-04-16 NOTE — Progress Notes (Signed)
Complaints of right breast pain x one year with improvement over the past two weeks. Patient states pain comes and goes rating at a 10 out of 10. Patient complained of a left breast lump x 2 weeks.  Pap Smear: Pap smear not completed today. [facility] and normal with negative HPV. Per patient has no history of an abnormal Pap smear. Last Pap smear result is in Epic.  Physical exam: Breasts Breasts symmetrical. No skin abnormalities bilateral breasts. No nipple retraction bilateral breasts. No nipple discharge bilateral breasts. No lymphadenopathy. No lumps palpated right breast. Palpated a lump within the left breast at 11:30 o'clock 11 cm from the nipple. Complaints of bilateral axillary and right upper breast tenderness on exam. [facility] and possible bilateral breast ultrasounds. Appointment scheduled for Thursday, April 16, 2017 at 1520.        Pelvic/Bimanual No Pap smear completed today since last Pap smear and HPV typing was 06/08/2012. Pap smear not indicated per [facility] guidelines.   Smoking History: Patient has never smoked.  Patient Navigation: Patient education provided. [facility].

## 2017-04-17 ENCOUNTER — Encounter (HOSPITAL_COMMUNITY): Payer: Self-pay

## 2017-06-23 ENCOUNTER — Ambulatory Visit (HOSPITAL_COMMUNITY): Payer: No Typology Code available for payment source

## 2017-06-23 ENCOUNTER — Encounter (HOSPITAL_COMMUNITY): Payer: Self-pay

## 2018-12-06 ENCOUNTER — Encounter (HOSPITAL_COMMUNITY): Payer: Self-pay | Admitting: Emergency Medicine

## 2018-12-06 ENCOUNTER — Ambulatory Visit (HOSPITAL_COMMUNITY)
Admission: EM | Admit: 2018-12-06 | Discharge: 2018-12-06 | Disposition: A | Discharge status: Home / Self Care | Payer: BLUE CROSS/BLUE SHIELD | Attending: Family Medicine | Admitting: Family Medicine

## 2018-12-06 ENCOUNTER — Other Ambulatory Visit: Payer: Self-pay

## 2018-12-06 DIAGNOSIS — N898 Other specified noninflammatory disorders of vagina: Secondary | ICD-10-CM | POA: Diagnosis present

## 2018-12-06 DIAGNOSIS — R300 Dysuria: Secondary | ICD-10-CM | POA: Diagnosis present

## 2018-12-06 LAB — POCT URINALYSIS DIP (DEVICE)
Bilirubin Urine: NEGATIVE
Glucose, UA: NEGATIVE mg/dL
Ketones, ur: NEGATIVE mg/dL
Nitrite: NEGATIVE
Protein, ur: NEGATIVE mg/dL
Specific Gravity, Urine: >=1.03 (ref 1.005–1.030)
Urobilinogen, UA: 0.2 mg/dL (ref 0.0–1.0)
pH: 6 (ref 5.0–8.0)

## 2018-12-06 MED ORDER — FLUCONAZOLE 150 MG PO TABS: 150.0000 mg | ORAL_TABLET | Freq: Every day | ORAL | 0 refills | Status: DC

## 2018-12-06 MED ORDER — NITROFURANTOIN MONOHYD MACRO 100 MG PO CAPS: 100.0000 mg | ORAL_CAPSULE | Freq: Two times a day (BID) | ORAL | 0 refills | Status: DC

## 2018-12-06 NOTE — Discharge Instructions (Addendum)
Treating for UTI and yeast infection Sending urine for culture and swab for further testing We will call you with any positive results

## 2018-12-06 NOTE — ED Provider Notes (Signed)
[facility]    CSN: [id number] Arrival date & time: 12/06/18  [id number]     History   Chief Complaint Chief Complaint  Patient presents with  . Vaginal Discharge    HPI [name] is a 48 y.o. female.   Patient is a 48 year old female presents today with vaginal discharge, burning, irritation, dysuria and foul odor with urination.  Symptoms have been constant over the last 4 days.  She is having some irritation when she wipes and noticing blood on the tissue.  Last menstrual period was approximately a week and a half ago.  She is currently sexually active with same sex partner.  She denies any concern for STDs.  Mild lower abdominal cramping at times.  Denies any abdominal pain, back pain, fevers, chills or body aches.  Denies any nausea or vomiting.  ROS per HPI      Past Medical History:  Diagnosis Date  . Anxiety   . Bipolar 1 disorder (HCC)   . Depression     There are no active problems to display for this patient.   Past Surgical History:  Procedure Laterality Date  . ANKLE SURGERY    . R ankle      unsure of why she had the surgery    OB History    Gravida  0   Para  0   Term  0   Preterm  0   AB  0   Living  0     SAB  0   TAB  0   Ectopic  0   Multiple  0   Live Births  0            Home Medications    Prior to Admission medications   Medication Sig Start Date End Date Taking? Authorizing Provider  fluconazole (DIFLUCAN) 150 MG tablet Take 1 tablet (150 mg total) by mouth daily. 12/06/18   Dahlia ByesBast, Madilynn Montante A, NP  ibuprofen (ADVIL,MOTRIN) 200 MG tablet Take 400-600 mg by mouth every 6 (six) hours as needed for moderate pain.    [provider]  nitrofurantoin, macrocrystal-monohydrate, (MACROBID) 100 MG capsule Take 1 capsule (100 mg total) by mouth 2 (two) times daily. 12/06/18   Dahlia ByesBast, Sartaj Hoskin A, NP  omeprazole (PRILOSEC) 20 MG capsule Take 1 capsule (20 mg total) by mouth daily. Patient not taking: Reported on  04/16/2017 11/08/16 12/06/18  Elpidio AnisUpstill, Shari, PA-C    Family History Family History  Problem Relation Age of Onset  . Breast cancer Sister 3635  . Hypertension Sister   . Diabetes Other   . Hypertension Other   . Diabetes Mother   . Hypertension Mother   . Diabetes Father   . Hypertension Father   . Hypertension Brother   . Hypertension Sister     Social History Social History   Tobacco Use  . Smoking status: Never Smoker  . Smokeless tobacco: Never Used  Substance Use Topics  . Alcohol use: Yes    Comment: every day  . Drug use: Yes    Types: Marijuana     Allergies   Patient has no known allergies.   Review of Systems Review of Systems   Physical Exam Triage Vital Signs ED Triage Vitals  Enc Vitals Group     BP 12/06/18 0928 118/72     Pulse Rate 12/06/18 0928 89     Resp 12/06/18 0928 18     Temp 12/06/18 0928 98.5 F (36.9 C)  Temp src --      SpO2 12/06/18 0928 100 %     Weight --      Height --      Head Circumference --      Peak Flow --      Pain Score 12/06/18 0929 0     Pain Loc --      Pain Edu? --      Excl. in GC? --    No data found.  Updated Vital Signs BP 118/72   Pulse 89   Temp 98.5 F (36.9 C)   Resp 18   SpO2 100%   Visual Acuity Right Eye Distance:   Left Eye Distance:   Bilateral Distance:    Right Eye Near:   Left Eye Near:    Bilateral Near:     Physical Exam Vitals signs and nursing note reviewed.  Constitutional:      General: She is not in acute distress.    Appearance: Normal appearance. She is not ill-appearing, toxic-appearing or diaphoretic.  HENT:     Head: Normocephalic.     Nose: Nose normal.     Mouth/Throat:     Pharynx: Oropharynx is clear.  Eyes:     Conjunctiva/sclera: Conjunctivae normal.  Neck:     Musculoskeletal: Normal range of motion.  Pulmonary:     Effort: Pulmonary effort is normal.  Abdominal:     Palpations: Abdomen is soft.     Tenderness: There is no abdominal tenderness.   Musculoskeletal: Normal range of motion.  Skin:    General: Skin is warm and dry.     Findings: No rash.  Neurological:     Mental Status: She is alert.  Psychiatric:        Mood and Affect: Mood normal.      UC Treatments / Results  Labs (all labs ordered are listed, but only abnormal results are displayed) Labs Reviewed  POCT URINALYSIS DIP (DEVICE) - Abnormal; Notable for the following components:      Result Value   Hgb urine dipstick SMALL (*)    Leukocytes,Ua TRACE (*)    All other components within normal limits  URINE CULTURE  CERVICOVAGINAL ANCILLARY ONLY    EKG   Radiology No results found.  Procedures Procedures (including critical care time)  Medications Ordered in UC Medications - No data to display  Initial Impression / Assessment and Plan / UC Course  I have reviewed the triage vital signs and the nursing notes.  Pertinent labs & imaging results that were available during my care of the patient were reviewed by me and considered in my medical decision making (see chart for details).     Urine with trace leuks and hematuria. Sending urine for culture will treat with Macrobid in meantime based on symptoms. Also treating for possible yeast infection with Diflucan. Sending self swab for testing Labs pending Final Clinical Impressions(s) / UC Diagnoses   Final diagnoses:  None     Discharge Instructions     Treating for UTI and yeast infection Sending urine for culture and swab for further testing We will call you with any positive results    ED Prescriptions    Medication Sig Dispense Auth. Provider   nitrofurantoin, macrocrystal-monohydrate, (MACROBID) 100 MG capsule Take 1 capsule (100 mg total) by mouth 2 (two) times daily. 10 capsule Stevenson Windmiller A, NP   fluconazole (DIFLUCAN) 150 MG tablet Take 1 tablet (150 mg total) by mouth daily. 2 tablet Jaci LazierBast,  Kemia Wendel A, NP     Controlled Substance Prescriptions [place] Controlled Substance Registry  consulted? Not Applicable   Orvan July, NP 12/06/18 1039

## 2018-12-06 NOTE — ED Triage Notes (Signed)
Pt states for the last 4 days shes had vaginal discharge, burning, lower pelvic pain. Pain when she wipes.

## 2018-12-07 LAB — URINE CULTURE: Culture: <10000 — AB

## 2018-12-08 LAB — CERVICOVAGINAL ANCILLARY ONLY
Candida vaginitis: NEGATIVE
Chlamydia: NEGATIVE
Neisseria Gonorrhea: NEGATIVE
Trichomonas: POSITIVE — AB

## 2018-12-10 ENCOUNTER — Telehealth (HOSPITAL_COMMUNITY): Payer: Self-pay | Admitting: Emergency Medicine

## 2018-12-10 MED ORDER — METRONIDAZOLE 500 MG PO TABS: 500.0000 mg | ORAL_TABLET | Freq: Two times a day (BID) | ORAL | 0 refills | Status: AC

## 2018-12-10 NOTE — Telephone Encounter (Signed)
Trichomonas is positive. Rx  for Flagyl 2 grams, once was sent to the pharmacy of record. Pt needs education to refrain from sexual intercourse for 7 days to give the medicine time to work. Sexual partners need to be notified and tested/treated. Condoms may reduce risk of reinfection. Recheck for further evaluation if symptoms are not improving.   Bacterial vaginosis is positive. This was not treated at the urgent care visit.  Flagyl 500 mg BID x 7 days #14 no refills sent to patients pharmacy of choice.    Patient contacted and made aware of    results, all questions answered

## 2020-04-26 ENCOUNTER — Ambulatory Visit: Payer: No Typology Code available for payment source

## 2020-10-18 ENCOUNTER — Other Ambulatory Visit: Payer: Self-pay | Admitting: Internal Medicine

## 2020-10-18 ENCOUNTER — Other Ambulatory Visit (HOSPITAL_COMMUNITY): Payer: Self-pay | Admitting: Internal Medicine

## 2020-10-18 DIAGNOSIS — R7989 Other specified abnormal findings of blood chemistry: Secondary | ICD-10-CM

## 2020-10-18 DIAGNOSIS — E349 Endocrine disorder, unspecified: Secondary | ICD-10-CM

## 2020-10-29 ENCOUNTER — Ambulatory Visit: Payer: 59 | Admitting: Internal Medicine

## 2020-10-29 NOTE — Progress Notes (Deleted)
Name: [name]  MRN/ DOB: [id number], November 29, 1970    Age/ Sex: 50 y.o., female    PCP: Patient, No Pcp Per (Inactive)   Reason for Endocrinology Evaluation: Hypercalcemia      Date of Initial Endocrinology Evaluation: 10/29/2020     HPI: [name] is a 50 y.o. female with a past medical history of ***. The patient presented for initial endocrinology clinic visit on 10/29/2020 for consultative assistance with her Hypercalcemia .   ***  HISTORY:  Past Medical History:  Past Medical History:  Diagnosis Date   Anxiety    Bipolar 1 disorder (HCC)    Depression    Past Surgical History:  Past Surgical History:  Procedure Laterality Date   ANKLE SURGERY     R ankle      unsure of why she had the surgery    Social History:  reports that she has never smoked. She has never used smokeless tobacco. She reports current alcohol use. She reports current drug use. Drug: Marijuana. Family History: family history includes Breast cancer (age of onset: 69) in her sister; Diabetes in her father, mother, and another family member; Hypertension in her brother, father, mother, sister, sister, and another family member.   HOME MEDICATIONS: Allergies as of 10/29/2020   No Known Allergies      Medication List        Accurate as of October 29, 2020  7:49 AM. If you have any questions, ask your nurse or doctor.          fluconazole 150 MG tablet Commonly known as: Diflucan Take 1 tablet (150 mg total) by mouth daily.   ibuprofen 200 MG tablet Commonly known as: ADVIL Take 400-600 mg by mouth every 6 (six) hours as needed for moderate pain.   nitrofurantoin (macrocrystal-monohydrate) 100 MG capsule Commonly known as: MACROBID Take 1 capsule (100 mg total) by mouth 2 (two) times daily.          REVIEW OF SYSTEMS: A comprehensive ROS was conducted with the patient and is negative except as per HPI and below:  ROS     OBJECTIVE:  VS: There were no vitals  taken for this visit.   Wt Readings from Last 3 Encounters:  04/16/17 297 lb 9.6 oz (135 kg)  11/08/16 300 lb (136.1 kg)  04/03/16 274 lb 3.2 oz (124.4 kg)     EXAM: General: Pt appears well and is in NAD  Hydration: Well-hydrated with moist mucous membranes and good skin turgor  Eyes: External eye exam normal without stare, lid lag or exophthalmos.  EOM intact.  PERRL.  Ears, Nose, Throat: Hearing: Grossly intact bilaterally Dental: Good dentition  Throat: Clear without mass, erythema or exudate  Neck: General: Supple without adenopathy. Thyroid: Thyroid size normal.  No goiter or nodules appreciated. No thyroid bruit.  Lungs: Clear with good BS bilat with no rales, rhonchi, or wheezes  Heart: Auscultation: RRR.  Abdomen: Normoactive bowel sounds, soft, nontender, without masses or organomegaly palpable  Extremities: Gait and station: Normal gait  Digits and nails: No clubbing, cyanosis, petechiae, or nodes Head and neck: Normal alignment and mobility BL UE: Normal ROM and strength. BL LE: No pretibial edema normal ROM and strength.  Skin: Hair: Texture and amount normal with gender appropriate distribution Skin Inspection: No rashes, acanthosis nigricans/skin tags. No lipohypertrophy Skin Palpation: Skin temperature, texture, and thickness normal to palpation  Neuro: Cranial nerves: II - XII grossly intact  Cerebellar: Normal coordination and  movement; no tremor Motor: Normal strength throughout DTRs: 2+ and symmetric in UE without delay in relaxation phase  Mental Status: Judgment, insight: Intact Orientation: Oriented to time, place, and person Memory: Intact for recent and remote events Mood and affect: No depression, anxiety, or agitation     DATA REVIEWED: ***    ASSESSMENT/PLAN/RECOMMENDATIONS:   ***    Medications :  Signed electronically by: Lyndle Herrlich, MD  [facility] Endocrinology  [facility] [address] [place] [place], [place] [address] Phone: [phone] FAX: [phone]   CC: Patient, No Pcp Per (Inactive) No address on file Phone: None Fax: None   Return to Endocrinology clinic as below: Future Appointments  Date Time Provider Department Center  10/29/2020  7:50 AM Marlissa Emerick, Konrad Dolores, MD [id number] None  11/08/2020 10:00 AM [facility] 1 [facility]  11/08/2020 12:00 PM [facility] 1 [facility]

## 2020-11-08 ENCOUNTER — Ambulatory Visit (HOSPITAL_COMMUNITY): Payer: 59

## 2020-11-15 ENCOUNTER — Ambulatory Visit (HOSPITAL_COMMUNITY)
Admission: RE | Admit: 2020-11-15 | Discharge: 2020-11-15 | Disposition: A | Discharge status: Home / Self Care | Payer: 59 | Source: Ambulatory Visit | Attending: Internal Medicine | Admitting: Internal Medicine

## 2020-11-15 ENCOUNTER — Other Ambulatory Visit: Payer: Self-pay

## 2020-11-15 DIAGNOSIS — R7989 Other specified abnormal findings of blood chemistry: Secondary | ICD-10-CM

## 2020-11-15 DIAGNOSIS — E349 Endocrine disorder, unspecified: Secondary | ICD-10-CM | POA: Diagnosis present

## 2021-02-07 NOTE — Progress Notes (Deleted)
Name: [name]  MRN/ DOB: [id number], 1970-10-21    Age/ Sex: 50 y.o., female    PCP: Patient, No Pcp Per (Inactive)   Reason for Endocrinology Evaluation: Hyperparathyroidism     Date of Initial Endocrinology Evaluation: 02/07/2021     HPI: Ms. [name] is a 50 y.o. female with a past medical history of obesity and seasonal allergies. The patient presented for initial endocrinology clinic visit on 02/07/2021 for consultative assistance with her hyperparathyroidism.   She was found to have an elevated PTH level at 153 pg/mL during routine labs and 08/2020, she had a normal serum calcium of 10.4 mg/DL (uncorrected)     HISTORY:  Past Medical History:  Past Medical History:  Diagnosis Date   Anxiety    Bipolar 1 disorder (HCC)    Depression    Past Surgical History:  Past Surgical History:  Procedure Laterality Date   ANKLE SURGERY     R ankle      unsure of why she had the surgery    Social History:  reports that she has never smoked. She has never used smokeless tobacco. She reports current alcohol use. She reports current drug use. Drug: Marijuana. Family History: family history includes Breast cancer (age of onset: 67) in her sister; Diabetes in her father, mother, and another family member; Hypertension in her brother, father, mother, sister, sister, and another family member.   HOME MEDICATIONS: Allergies as of 02/08/2021   No Known Allergies      Medication List        Accurate as of February 07, 2021  2:06 PM. If you have any questions, ask your nurse or doctor.          fluconazole 150 MG tablet Commonly known as: Diflucan Take 1 tablet (150 mg total) by mouth daily.   ibuprofen 200 MG tablet Commonly known as: ADVIL Take 400-600 mg by mouth every 6 (six) hours as needed for moderate pain.   nitrofurantoin (macrocrystal-monohydrate) 100 MG capsule Commonly known as: MACROBID Take 1 capsule (100 mg total) by mouth 2 (two) times  daily.          REVIEW OF SYSTEMS: A comprehensive ROS was conducted with the patient and is negative except as per HPI and below:  ROS     OBJECTIVE:  VS: LMP 03/24/2017 (Approximate)    Wt Readings from Last 3 Encounters:  04/16/17 297 lb 9.6 oz (135 kg)  11/08/16 300 lb (136.1 kg)  04/03/16 274 lb 3.2 oz (124.4 kg)     EXAM: General: Pt appears well and is in NAD  Hydration: Well-hydrated with moist mucous membranes and good skin turgor  Eyes: External eye exam normal without stare, lid lag or exophthalmos.  EOM intact.  PERRL.  Ears, Nose, Throat: Hearing: Grossly intact bilaterally Dental: Good dentition  Throat: Clear without mass, erythema or exudate  Neck: General: Supple without adenopathy. Thyroid: Thyroid size normal.  No goiter or nodules appreciated. No thyroid bruit.  Lungs: Clear with good BS bilat with no rales, rhonchi, or wheezes  Heart: Auscultation: RRR.  Abdomen: Normoactive bowel sounds, soft, nontender, without masses or organomegaly palpable  Extremities: Gait and station: Normal gait  Digits and nails: No clubbing, cyanosis, petechiae, or nodes Head and neck: Normal alignment and mobility BL UE: Normal ROM and strength. BL LE: No pretibial edema normal ROM and strength.  Skin: Hair: Texture and amount normal with gender appropriate distribution Skin Inspection: No rashes, acanthosis nigricans/skin tags.  No lipohypertrophy Skin Palpation: Skin temperature, texture, and thickness normal to palpation  Neuro: Cranial nerves: II - XII grossly intact  Cerebellar: Normal coordination and movement; no tremor Motor: Normal strength throughout DTRs: 2+ and symmetric in UE without delay in relaxation phase  Mental Status: Judgment, insight: Intact Orientation: Oriented to time, place, and person Memory: Intact for recent and remote events Mood and affect: No depression, anxiety, or agitation     DATA REVIEWED: 08/16/2020 PTH 153 Calcium  10.4 Rheumatoid factor less than 14 CCP less than 16 A1c 5.2% ASSESSMENT/PLAN/RECOMMENDATIONS:   Hyperparathyroidism:    Medications :  Signed electronically by: Lyndle Herrlich, MD  [facility]  [facility] [address] [place], [place] [address] Phone: [phone] FAX: [phone]   CC: Patient, No Pcp Per (Inactive) No address on file Phone: None Fax: None   Return to Endocrinology clinic as below: Future Appointments  Date Time Provider Department Center  02/08/2021 10:50 AM [name], Kristin Dolores, MD [facility] None

## 2021-02-08 ENCOUNTER — Ambulatory Visit: Payer: 59 | Admitting: Internal Medicine

## 2022-03-03 ENCOUNTER — Ambulatory Visit (INDEPENDENT_AMBULATORY_CARE_PROVIDER_SITE_OTHER): Payer: 59

## 2022-03-03 ENCOUNTER — Ambulatory Visit (HOSPITAL_COMMUNITY)
Admission: EM | Admit: 2022-03-03 | Discharge: 2022-03-03 | Disposition: A | Discharge status: Home / Self Care | Payer: 59 | Attending: Family Medicine | Admitting: Family Medicine

## 2022-03-03 ENCOUNTER — Encounter (HOSPITAL_COMMUNITY): Payer: Self-pay

## 2022-03-03 DIAGNOSIS — M79672 Pain in left foot: Secondary | ICD-10-CM

## 2022-03-03 MED ORDER — TRAMADOL HCL 50 MG PO TABS: 50.0000 mg | ORAL_TABLET | Freq: Four times a day (QID) | ORAL | 0 refills | Status: DC | PRN

## 2022-03-03 MED ORDER — PREDNISONE 20 MG PO TABS: 40.0000 mg | ORAL_TABLET | Freq: Every day | ORAL | 0 refills | Status: DC

## 2022-03-03 MED ORDER — PREDNISONE 20 MG PO TABS: 40.0000 mg | ORAL_TABLET | Freq: Every day | ORAL | 0 refills | Status: AC

## 2022-03-03 NOTE — ED Triage Notes (Signed)
Pt stated she woke up today with left leg pain , unable to walk

## 2022-03-03 NOTE — ED Provider Notes (Signed)
[facility]    CSN: [id number] Arrival date & time: 03/03/22  9390      History   Chief Complaint Chief Complaint  Patient presents with   Foot Pain    HPI [name] is a 51 y.o. female.    Foot Pain   With left foot pain that she first noted this morning.  It hurts to bear weight.  Also if she flexes or inverts her foot at the ankle it causes sudden lightening type pain. No trauma or overuse or fall.  No fever or aches.  She did note some pain and aching in her right upper back yesterday and the day before   Past Medical History:  Diagnosis Date   Anxiety    Bipolar 1 disorder (HCC)    Depression     There are no problems to display for this patient.   Past Surgical History:  Procedure Laterality Date   ANKLE SURGERY     R ankle      unsure of why she had the surgery    OB History     Gravida  0   Para  0   Term  0   Preterm  0   AB  0   Living  0      SAB  0   IAB  0   Ectopic  0   Multiple  0   Live Births  0            Home Medications    Prior to Admission medications   Medication Sig Start Date End Date Taking? Authorizing Provider  predniSONE (DELTASONE) 20 MG tablet Take 2 tablets (40 mg total) by mouth daily with breakfast for 5 days. 03/03/22 03/08/22 Yes Fartun Paradiso, Janace Aris, MD  traMADol (ULTRAM) 50 MG tablet Take 1 tablet (50 mg total) by mouth every 6 (six) hours as needed (pain). 03/03/22  Yes Zenia Resides, MD  omeprazole (PRILOSEC) 20 MG capsule Take 1 capsule (20 mg total) by mouth daily. Patient not taking: Reported on 04/16/2017 11/08/16 12/06/18  Elpidio Anis, PA-C    Family History Family History  Problem Relation Age of Onset   Breast cancer Sister 42   Hypertension Sister    Diabetes Other    Hypertension Other    Diabetes Mother    Hypertension Mother    Diabetes Father    Hypertension Father    Hypertension Brother    Hypertension Sister     Social History Social  History   Tobacco Use   Smoking status: Never   Smokeless tobacco: Never  Vaping Use   Vaping Use: Former  Substance Use Topics   Alcohol use: Yes    Comment: every day   Drug use: Yes    Types: Marijuana     Allergies   Patient has no known allergies.   Review of Systems Review of Systems   Physical Exam Triage Vital Signs ED Triage Vitals  Enc Vitals Group     BP 03/03/22 0837 (!) 142/88     Pulse Rate 03/03/22 0837 88     Resp 03/03/22 0837 12     Temp 03/03/22 0837 98.2 F (36.8 C)     Temp Source 03/03/22 0837 Oral     SpO2 03/03/22 0837 98 %     Weight --      Height --      Head Circumference --      Peak Flow --  Pain Score 03/03/22 0834 10     Pain Loc --      Pain Edu? --      Excl. in [place]? --    No data found.  Updated Vital Signs BP (!) 142/88 (BP Location: Right Arm)   Pulse 88   Temp 98.2 F (36.8 C) (Oral)   Resp 12   LMP 03/24/2017 (Approximate)   SpO2 98%   Visual Acuity Right Eye Distance:   Left Eye Distance:   Bilateral Distance:    Right Eye Near:   Left Eye Near:    Bilateral Near:     Physical Exam Vitals reviewed.  Constitutional:      General: She is not in acute distress.    Appearance: She is not ill-appearing, toxic-appearing or diaphoretic.  Musculoskeletal:     Comments: There is maybe a little edema of the left anterior foot on the dorsum.  Pulses are 2+ at the [facility] and PT area.  There is no swelling of the sole of the foot.  Its not really tender  Skin:    Coloration: Skin is not pale.  Neurological:     General: No focal deficit present.     Mental Status: She is alert and oriented to person, place, and time.  Psychiatric:        Behavior: Behavior normal.      UC Treatments / Results  Labs (all labs ordered are listed, but only abnormal results are displayed) Labs Reviewed - No data to display  EKG   Radiology DG Foot Complete Left  Result Date: 03/03/2022 CLINICAL DATA:  Left forefoot pain  EXAM: LEFT FOOT - COMPLETE 3+ VIEW COMPARISON:  None Available. FINDINGS: No acute fracture or dislocation. No aggressive osseous lesion. Normal alignment. Mild osteoarthritis of the first MTP joint. Enthesopathic changes of the Achilles tendon insertion. Mild osteoarthritis of the first TMT joint. Soft tissue are unremarkable. No radiopaque foreign body or soft tissue emphysema. IMPRESSION: 1.  No acute osseous injury of the left forefoot. Electronically Signed   By: Kathreen Devoid M.D.   On: 03/03/2022 09:50    Procedures Procedures (including critical care time)  Medications Ordered in UC Medications - No data to display  Initial Impression / Assessment and Plan / UC Course  I have reviewed the triage vital signs and the nursing notes.  Pertinent labs & imaging results that were available during my care of the patient were reviewed by me and considered in my medical decision making (see chart for details).        X-ray does not show any acute bony pathology.  Prednisone is sent in for inflammation, and tramadol for pain.  Okay on PMP.  She is provided crutches, and I have asked her to please follow-up with her primary care about this issue Final Clinical Impressions(s) / UC Diagnoses   Final diagnoses:  Foot pain, left     Discharge Instructions      X-ray did not show any abnormality of the bones of your foot.  Take prednisone 20 mg--2 daily for 5 days; this is for inflammation  Take tramadol 50 mg-- 1 tablet every 6 hours as needed for pain.  This medication can make you sleepy or dizzy   Please call your primary care office for a follow-up appointment     ED Prescriptions     Medication Sig Dispense Auth. Provider   predniSONE (DELTASONE) 20 MG tablet Take 2 tablets (40 mg total) by mouth daily  with breakfast for 5 days. 10 tablet Barrett Henle, MD   traMADol (ULTRAM) 50 MG tablet Take 1 tablet (50 mg total) by mouth every 6 (six) hours as needed (pain). 12  tablet Azael Ragain, Gwenlyn Perking, MD      I have reviewed the PDMP during this encounter.   Barrett Henle, MD 03/03/22 1005

## 2022-03-03 NOTE — Discharge Instructions (Addendum)
X-ray did not show any abnormality of the bones of your foot.  Take prednisone 20 mg--2 daily for 5 days; this is for inflammation  Take tramadol 50 mg-- 1 tablet every 6 hours as needed for pain.  This medication can make you sleepy or dizzy   Please call your primary care office for a follow-up appointment

## 2022-05-11 IMAGING — NM NM PARATHYROID W/ SPECT
2 series · 12 of 12 positions shown · non-contrast
Comparison: None.

CLINICAL DATA: Hyperparathyroidism.

EXAM:
NM PARATHYROID SCINTIGRAPHY AND SPECT IMAGING
TECHNIQUE: Following intravenous administration of radiopharmaceutical, early
and 2-hour delayed planar images were obtained in the anterior
projection. Delayed triplanar SPECT images were also obtained at 2
hours.
RADIOPHARMACEUTICALS:  26.1 mCi Qc-EEm Sestamibi IV

[Series 1: spect - (id)_(id)_cor · 4.1mm · 4.14mm/px · 6 of 128 frames shown]
[frame 11/128]
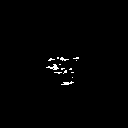
[frame 32/128]
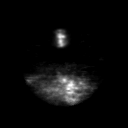
[frame 54/128]
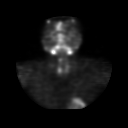
[frame 75/128]
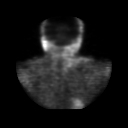
[frame 96/128]
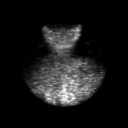
[frame 118/128]
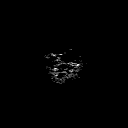

[Series 1: spect - (id)_(id)_tra · 4.1mm · 4.14mm/px · 6 of 128 frames shown]
[frame 11/128]
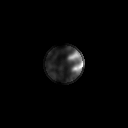
[frame 32/128]
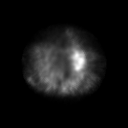
[frame 54/128]
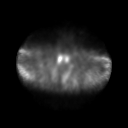
[frame 75/128]
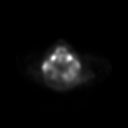
[frame 96/128]
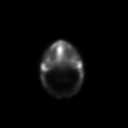
[frame 118/128]
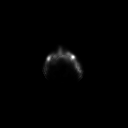

[12 of 12 positions shown; findings below may reference images not displayed]

FINDINGS: Initial planar images of the neck and superior mediastinum
demonstrates homogeneous thyroid activity bilaterally. Delayed
planar imaging demonstrates no definite residual focal abnormality.
However, on delayed SPECT imaging, there is prominent asymmetric
activity projecting over the inferior aspect of the right thyroid
lobe, suspicious for an inferior parathyroid adenoma. There is a
smaller focus of residual activity superiorly on the right. No focal
residual activity is seen on the left or elsewhere in the neck.
IMPRESSION: SPECT imaging demonstrates 2 foci of sestamibi uptake in the right
paratracheal region, the largest focus more inferiorly. Either of
these could reflect a parathyroid adenoma. No suspicious left neck
activity.

## 2024-03-06 ENCOUNTER — Ambulatory Visit (HOSPITAL_COMMUNITY)
Admission: EM | Admit: 2024-03-06 | Discharge: 2024-03-06 | Disposition: A | Discharge status: Home / Self Care | Attending: Emergency Medicine | Admitting: Emergency Medicine

## 2024-03-06 ENCOUNTER — Encounter (HOSPITAL_COMMUNITY): Payer: Self-pay

## 2024-03-06 DIAGNOSIS — R10A1 Flank pain, right side: Secondary | ICD-10-CM | POA: Insufficient documentation

## 2024-03-06 DIAGNOSIS — N12 Tubulo-interstitial nephritis, not specified as acute or chronic: Secondary | ICD-10-CM | POA: Insufficient documentation

## 2024-03-06 LAB — CBC WITH DIFFERENTIAL/PLATELET
Abs Immature Granulocytes: 0.02 K/uL (ref 0.00–0.07)
Basophils Absolute: 0 K/uL (ref 0.0–0.1)
Basophils Relative: 1 %
Eosinophils Absolute: 0.1 K/uL (ref 0.0–0.5)
Eosinophils Relative: 2 %
HCT: 45.5 % (ref 36.0–46.0)
Hemoglobin: 15.3 g/dL — ABNORMAL HIGH (ref 12.0–15.0)
Immature Granulocytes: 0 %
Lymphocytes Relative: 34 %
Lymphs Abs: 1.7 K/uL (ref 0.7–4.0)
MCH: 30.4 pg (ref 26.0–34.0)
MCHC: 33.6 g/dL (ref 30.0–36.0)
MCV: 90.3 fL (ref 80.0–100.0)
Monocytes Absolute: 0.5 K/uL (ref 0.1–1.0)
Monocytes Relative: 10 %
Neutro Abs: 2.8 K/uL (ref 1.7–7.7)
Neutrophils Relative %: 53 %
Platelets: 281 K/uL (ref 150–400)
RBC: 5.04 MIL/uL (ref 3.87–5.11)
RDW: 13.2 % (ref 11.5–15.5)
WBC: 5.1 K/uL (ref 4.0–10.5)
nRBC: 0 % (ref 0.0–0.2)

## 2024-03-06 LAB — BASIC METABOLIC PANEL WITH GFR
Anion gap: 11 (ref 5–15)
BUN: 16 mg/dL (ref 6–20)
CO2: 20 mmol/L — ABNORMAL LOW (ref 22–32)
Calcium: 10.4 mg/dL — ABNORMAL HIGH (ref 8.9–10.3)
Chloride: 106 mmol/L (ref 98–111)
Creatinine, Ser: 0.75 mg/dL (ref 0.44–1.00)
GFR, Estimated: >60 mL/min (ref >60)
Glucose, Bld: 131 mg/dL — ABNORMAL HIGH (ref 70–99)
Potassium: 4 mmol/L (ref 3.5–5.1)
Sodium: 137 mmol/L (ref 135–145)

## 2024-03-06 LAB — POCT URINE DIPSTICK
Bilirubin, UA: NEGATIVE
Glucose, UA: NEGATIVE mg/dL
Ketones, POC UA: NEGATIVE mg/dL
Leukocytes, UA: NEGATIVE
Nitrite, UA: NEGATIVE
POC PROTEIN,UA: NEGATIVE
Spec Grav, UA: 1.025 (ref 1.010–1.025)
Urobilinogen, UA: 0.2 U/dL
pH, UA: 5.5 (ref 5.0–8.0)

## 2024-03-06 MED ORDER — SULFAMETHOXAZOLE-TRIMETHOPRIM 800-160 MG PO TABS: 2.0000 | ORAL_TABLET | Freq: Two times a day (BID) | ORAL | 0 refills | Status: AC

## 2024-03-06 MED ORDER — CEFTRIAXONE SODIUM 1 G IJ SOLR
INTRAMUSCULAR | Status: AC
  Filled 2024-03-06: qty 10

## 2024-03-06 MED ORDER — IBUPROFEN 600 MG PO TABS: 600.0000 mg | ORAL_TABLET | Freq: Three times a day (TID) | ORAL | 0 refills | Status: AC | PRN

## 2024-03-06 MED ORDER — CEFTRIAXONE SODIUM 1 G IJ SOLR
1.0000 g | Freq: Once | INTRAMUSCULAR | Status: AC
  Administered 2024-03-06: 1 g via INTRAMUSCULAR

## 2024-03-06 MED ORDER — LIDOCAINE HCL (PF) 1 % IJ SOLN
INTRAMUSCULAR | Status: AC
  Filled 2024-03-06: qty 2

## 2024-03-06 NOTE — ED Provider Notes (Signed)
 [facility]    CSN: [id number] Arrival date & time: 03/06/24  9195    HISTORY   Chief Complaint  Patient presents with   Flank Pain   HPI [name] is a pleasant, 53 y.o. female who presents to urgent care today. Patient complains of acute onset of intermittent pain in her right flank that she describes as stabbing in quality.  Patient also endorses fatigue, alternating hot flashes and chills, states hot flashes are not new due to being post menopausal but the chills are new.  Patient states this has been going on for 2 to 3 days.  Patient states she is now also having pain in her lower back radiating to her buttocks.  Patient denies recent fall, injury, constipation, history of chronic back pain.  Patient endorses increased frequency of urination but has not noticed any pain while urinating.  Of note, patient's blood pressure trend over the past several years was evaluated by me, blood pressure today is significantly lower than normal for her, patient states she also smoked a cigarette prior to arrival today.  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  The history is provided by the patient.  Flank Pain    Past Medical History:  Diagnosis Date   Anxiety    Bipolar 1 disorder (HCC)    Depression    There are no active problems to display for this patient.  Past Surgical History:  Procedure Laterality Date   ANKLE SURGERY     R ankle      unsure of why she had the surgery   OB History     Gravida  0   Para  0   Term  0   Preterm  0   AB  0   Living  0      SAB  0   IAB  0   Ectopic  0   Multiple  0   Live Births  0          Home Medications    Prior to Admission medications   Medication Sig Start Date End Date Taking? Authorizing Provider  omeprazole  (PRILOSEC) 20 MG capsule Take 1 capsule (20 mg total) by mouth daily. Patient not taking: Reported on 04/16/2017  11/08/16 12/06/18  Odell Balls, PA-C    Family History Family History  Problem Relation Age of Onset   Breast cancer Sister 59   Hypertension Sister    Diabetes Other    Hypertension Other    Diabetes Mother    Hypertension Mother    Diabetes Father    Hypertension Father    Hypertension Brother    Hypertension Sister    Social History Social History   Tobacco Use   Smoking status: Never   Smokeless tobacco: Never  Vaping Use   Vaping status: Former  Substance Use Topics   Alcohol use: Yes    Comment: every day   Drug use: Yes    Types: Marijuana   Allergies   Patient has no known allergies.  Review of Systems Review of Systems  Genitourinary:  Positive for flank pain.   Pertinent findings revealed after performing a 14 point review of systems has been noted in the history of present illness.  Physical Exam Vital Signs BP (!) 110/57 (BP Location: Left Arm)   Pulse 90   Temp 98.6 F (37 C) (Oral)   Resp 18   Ht 6' 2 (  1.88 m)   LMP 03/24/2017 (Approximate)   SpO2 96%   BMI 38.21 kg/m   No data found.  Physical Exam Constitutional:      Appearance: She is ill-appearing.  HENT:     Head: Normocephalic and atraumatic.     Salivary Glands: Right salivary gland is not diffusely enlarged or tender. Left salivary gland is not diffusely enlarged or tender.     Right Ear: Tympanic membrane, ear canal and external ear normal.     Left Ear: Tympanic membrane, ear canal and external ear normal.     Nose: Congestion and rhinorrhea present. Rhinorrhea is clear.     Right Sinus: No maxillary sinus tenderness or frontal sinus tenderness.     Left Sinus: No maxillary sinus tenderness.     Mouth/Throat:     Mouth: Mucous membranes are moist.     Pharynx: Pharyngeal swelling, posterior oropharyngeal erythema and uvula swelling present.     Tonsils: No tonsillar exudate. 0 on the right. 0 on the left.  Cardiovascular:     Rate and Rhythm: Normal rate and regular  rhythm.     Pulses: Normal pulses.  Pulmonary:     Effort: Pulmonary effort is normal. No accessory muscle usage, prolonged expiration or respiratory distress.     Breath sounds: No stridor. No wheezing, rhonchi or rales.     Comments: Turbulent breath sounds throughout without wheeze, rale, rhonchi. Abdominal:     General: Abdomen is flat. Bowel sounds are normal.     Palpations: Abdomen is soft.     Tenderness: There is no abdominal tenderness. There is right CVA tenderness.  Musculoskeletal:        General: Normal range of motion.     Lumbar back: Spasms and tenderness present. No bony tenderness. Normal range of motion.  Lymphadenopathy:     Cervical: Cervical adenopathy present.     Right cervical: Superficial cervical adenopathy and posterior cervical adenopathy present.     Left cervical: Superficial cervical adenopathy and posterior cervical adenopathy present.  Skin:    General: Skin is warm and dry.  Neurological:     General: No focal deficit present.     Mental Status: She is alert and oriented to person, place, and time.     Motor: Motor function is intact.     Coordination: Coordination is intact.     Gait: Gait is intact.     Deep Tendon Reflexes: Reflexes are normal and symmetric.  Psychiatric:        Attention and Perception: Attention and perception normal.        Mood and Affect: Mood and affect normal.        Speech: Speech normal.        Behavior: Behavior normal. Behavior is cooperative.        Thought Content: Thought content normal.     Visual Acuity Right Eye Distance:   Left Eye Distance:   Bilateral Distance:    Right Eye Near:   Left Eye Near:    Bilateral Near:     UC Couse / Diagnostics / Procedures:     Radiology No results found.  Procedures Procedures (including critical care time) EKG  Pending results:  Labs Reviewed  POCT URINE DIPSTICK - Abnormal; Notable for the following components:      Result Value   Color, UA straw (*)     Blood, UA moderate (*)    All other components within normal limits  URINE CULTURE  BASIC METABOLIC PANEL WITH GFR  CBC WITH DIFFERENTIAL/PLATELET    Medications Ordered in UC: Medications  cefTRIAXone (ROCEPHIN) injection 1 g (1 g Intramuscular Given 03/06/24 0855)    UC Diagnoses / Final Clinical Impressions(s)   I have reviewed the triage vital signs and the nursing notes.  Pertinent labs & imaging results that were available during my care of the patient were reviewed by me and considered in my medical decision making (see chart for details).    Final diagnoses:  Right flank pain  Pyelonephritis   Urinalysis today revealed moderate red blood cells, patient denies personal and family history of kidney stones.  Based on presence of blood in urine, increased frequency of urination, recent complaints of fatigue and stabbing right flank pain with obvious CVA tenderness on exam, will treat patient empirically for presumed pyelonephritis with an injection of ceftriaxone and a 7-day course of Bactrim double strength 2 tablets twice daily while we await the results of urine culture.  CBC checked to evaluate for signs of systemic infection, again due to lower than normal blood pressure on arrival today.  Basic metabolic panel checked to evaluate kidney function, patient was provided with prescription for ibuprofen 600 mg which she has been advised not to take until the results of her BMP are available.  Patient further advised that if her symptoms worsen the next 2 to 3 days despite these recommendations, she is to go to the emergency room for further evaluation.  Please see discharge instructions below for details of plan of care as provided to patient. ED Prescriptions     Medication Sig Dispense Auth. Provider   sulfamethoxazole-trimethoprim (BACTRIM DS) 800-160 MG tablet Take 2 tablets by mouth 2 (two) times daily for 7 days. 28 tablet Joesph Shaver Scales, PA-C   ibuprofen (ADVIL)  600 MG tablet Take 1 tablet (600 mg total) by mouth every 8 (eight) hours as needed for up to 30 doses for fever, headache, mild pain (pain score 1-3) or moderate pain (pain score 4-6) (Inflammation). Take 1 tablet 3 times daily as needed for inflammation of upper airways and/or pain. 30 tablet Joesph Shaver Scales, PA-C      PDMP not reviewed this encounter.  Disposition Upon Discharge:  Condition: stable for discharge home  Patient presented with concern for an acute illness with associated systemic symptoms and significant discomfort requiring urgent management. In my opinion, this is a condition that a prudent lay person (someone who possesses an average knowledge of health and medicine) may potentially expect to result in complications if not addressed urgently such as respiratory distress, impairment of bodily function or dysfunction of bodily organs.   As such, the patient has been evaluated and assessed, work-up was performed and treatment was provided in alignment with urgent care protocols and evidence based medicine.  Patient/parent/caregiver has been advised that the patient may require follow up for further testing and/or treatment if the symptoms continue in spite of treatment, as clinically indicated and appropriate.  Routine symptom specific, illness specific and/or disease specific instructions were discussed with the patient and/or caregiver at length.  Prevention strategies for avoiding STD exposure were also discussed.  The patient will follow up with their current PCP if and as advised. If the patient does not currently have a PCP we will assist them in obtaining one.   The patient may need specialty follow up if the symptoms continue, in spite of conservative treatment and management, for further workup, evaluation, consultation and treatment as clinically  indicated and appropriate.  Patient/parent/caregiver verbalized understanding and agreement of plan as discussed.  All  questions were addressed during visit.  Please see discharge instructions below for further details of plan.    Discharge Instructions      Common causes of urinary tract infections include but are not limited to holding your urine longer than you should, squatting instead of sitting down when urinating, sitting around in wet clothing such as a wet swimsuit or gym clothes too long, not emptying your bladder after having sexual intercourse, wiping from back to front instead of front to back after having a bowel movement.     The urinalysis that we performed in the clinic today was abnormal.  Urine culture will be performed per our protocol.  The result of the urine culture will be available in the next 3 to 5 days and will be posted to your MyChart account.  If there is an abnormal finding, you will be contacted by phone and advised of further treatment recommendations, if any.   You were advised to begin antibiotics today because your urinalysis is abnormal and you are having active symptoms of an acute upper urinary tract infection also known as pyelonephritis (kidney infection).  It is very important that you take all doses exactly as prescribed.  Incomplete antibiotic therapy can cause worsening bacterial infection.   For initial treatment, you received an injection of a strong antibiotic called ceftriaxone during your today to help expedite treatment of infection.   Please also pick up and begin taking your prescription for Bactrim DS (trimethoprim sulfamethoxazole) as soon as possible, take your first dose this evening.  Please take all doses exactly as prescribed.  You can take this medication with or without food.  This medication is safe to take with your other medications.   If you receive a phone call advising you that your urine culture is negative but you begin to feel better after taking antibiotics for 24 hours, please feel free to complete the full course of antibiotics as they were  likely needed and the urine culture result was false.  If your culture is negative and you do not feel any better, please return for repeat evaluation of other possible causes of your symptoms.  The blood work that we performed today will evaluate your white blood cell count to see if you have signs of active infection as well as your kidney function.  We will contact you with those results once we receive them.   For relief of flank pain, I have also sent a prescription for ibuprofen 600 mg to your pharmacy.  You can take this every 8 hours as needed.  Please wait until we have received the results of your blood work before you begin ibuprofen.  If you have not had complete resolution of your symptoms after completing treatment as prescribed, please return to urgent care for repeat evaluation or follow-up with your primary care provider.  Repeat urinalysis and urine culture may be indicated for more directed therapy.   Thank you for visiting [facility] today.  We appreciate the opportunity to participate in your care.       This office note has been dictated using Teaching laboratory technician.  Unfortunately, this method of dictation can sometimes lead to typographical or grammatical errors.  I apologize for your inconvenience in advance if this occurs.  Please do not hesitate to reach out to me if clarification is needed.  Joesph Shaver Scales, [place] 03/06/24 [phone]

## 2024-03-06 NOTE — ED Triage Notes (Signed)
 Right flank pain on set 3 days ago. Now having pain in the lower back to buttocks area. No falls or injuries. Patient having urinary frequency, denies painful urination.

## 2024-03-06 NOTE — Discharge Instructions (Signed)
 Common causes of urinary tract infections include but are not limited to holding your urine longer than you should, squatting instead of sitting down when urinating, sitting around in wet clothing such as a wet swimsuit or gym clothes too long, not emptying your bladder after having sexual intercourse, wiping from back to front instead of front to back after having a bowel movement.     The urinalysis that we performed in the clinic today was abnormal.  Urine culture will be performed per our protocol.  The result of the urine culture will be available in the next 3 to 5 days and will be posted to your MyChart account.  If there is an abnormal finding, you will be contacted by phone and advised of further treatment recommendations, if any.   You were advised to begin antibiotics today because your urinalysis is abnormal and you are having active symptoms of an acute upper urinary tract infection also known as pyelonephritis (kidney infection).  It is very important that you take all doses exactly as prescribed.  Incomplete antibiotic therapy can cause worsening bacterial infection.   For initial treatment, you received an injection of a strong antibiotic called ceftriaxone during your today to help expedite treatment of infection.   Please also pick up and begin taking your prescription for Bactrim DS (trimethoprim sulfamethoxazole) as soon as possible, take your first dose this evening.  Please take all doses exactly as prescribed.  You can take this medication with or without food.  This medication is safe to take with your other medications.   If you receive a phone call advising you that your urine culture is negative but you begin to feel better after taking antibiotics for 24 hours, please feel free to complete the full course of antibiotics as they were likely needed and the urine culture result was false.  If your culture is negative and you do not feel any better, please return for repeat  evaluation of other possible causes of your symptoms.  The blood work that we performed today will evaluate your white blood cell count to see if you have signs of active infection as well as your kidney function.  We will contact you with those results once we receive them.   For relief of flank pain, I have also sent a prescription for ibuprofen 600 mg to your pharmacy.  You can take this every 8 hours as needed.  Please wait until we have received the results of your blood work before you begin ibuprofen.  If you have not had complete resolution of your symptoms after completing treatment as prescribed, please return to urgent care for repeat evaluation or follow-up with your primary care provider.  Repeat urinalysis and urine culture may be indicated for more directed therapy.   Thank you for visiting [facility] today.  We appreciate the opportunity to participate in your care.

## 2024-03-07 ENCOUNTER — Ambulatory Visit (HOSPITAL_COMMUNITY): Payer: Self-pay

## 2024-03-07 LAB — URINE CULTURE: Culture: NO GROWTH

## 2024-03-07 NOTE — Progress Notes (Signed)
 Please advise patient that urine culture was negative and she can discontinue antibiotics at this time.  The pain in her right flank is most likely musculoskeletal.  Because her metabolic panel revealed normal kidney function, she is welcome to pick up the ibuprofen 600 mg tablets taken as directed for her flank pain.

## 2024-03-08 DIAGNOSIS — K641 Second degree hemorrhoids: Secondary | ICD-10-CM | POA: Diagnosis not present

## 2024-03-08 DIAGNOSIS — Z131 Encounter for screening for diabetes mellitus: Secondary | ICD-10-CM | POA: Diagnosis not present

## 2024-03-08 DIAGNOSIS — E66811 Obesity, class 1: Secondary | ICD-10-CM | POA: Diagnosis not present

## 2024-03-08 DIAGNOSIS — E782 Mixed hyperlipidemia: Secondary | ICD-10-CM | POA: Diagnosis not present

## 2024-03-08 DIAGNOSIS — Z0001 Encounter for general adult medical examination with abnormal findings: Secondary | ICD-10-CM | POA: Diagnosis not present

## 2024-03-08 DIAGNOSIS — Z6833 Body mass index (BMI) 33.0-33.9, adult: Secondary | ICD-10-CM | POA: Diagnosis not present

## 2024-03-08 DIAGNOSIS — Z720 Tobacco use: Secondary | ICD-10-CM | POA: Diagnosis not present

## 2024-03-08 DIAGNOSIS — Z136 Encounter for screening for cardiovascular disorders: Secondary | ICD-10-CM | POA: Diagnosis not present

## 2024-03-08 DIAGNOSIS — E6609 Other obesity due to excess calories: Secondary | ICD-10-CM | POA: Diagnosis not present

## 2024-03-08 DIAGNOSIS — E213 Hyperparathyroidism, unspecified: Secondary | ICD-10-CM | POA: Diagnosis not present

## 2024-03-08 DIAGNOSIS — Z1329 Encounter for screening for other suspected endocrine disorder: Secondary | ICD-10-CM | POA: Diagnosis not present

## 2024-03-22 DIAGNOSIS — Z720 Tobacco use: Secondary | ICD-10-CM | POA: Diagnosis not present

## 2024-03-22 DIAGNOSIS — E213 Hyperparathyroidism, unspecified: Secondary | ICD-10-CM | POA: Diagnosis not present

## 2024-03-22 DIAGNOSIS — E782 Mixed hyperlipidemia: Secondary | ICD-10-CM | POA: Diagnosis not present

## 2024-03-22 DIAGNOSIS — M25551 Pain in right hip: Secondary | ICD-10-CM | POA: Diagnosis not present

## 2024-03-22 DIAGNOSIS — E6609 Other obesity due to excess calories: Secondary | ICD-10-CM | POA: Diagnosis not present

## 2024-03-22 DIAGNOSIS — Z6833 Body mass index (BMI) 33.0-33.9, adult: Secondary | ICD-10-CM | POA: Diagnosis not present

## 2024-03-22 DIAGNOSIS — K641 Second degree hemorrhoids: Secondary | ICD-10-CM | POA: Diagnosis not present

## 2024-03-28 DIAGNOSIS — E6609 Other obesity due to excess calories: Secondary | ICD-10-CM | POA: Diagnosis not present

## 2024-03-28 DIAGNOSIS — E213 Hyperparathyroidism, unspecified: Secondary | ICD-10-CM | POA: Diagnosis not present

## 2024-03-28 DIAGNOSIS — Z6833 Body mass index (BMI) 33.0-33.9, adult: Secondary | ICD-10-CM | POA: Diagnosis not present

## 2024-03-28 DIAGNOSIS — Z720 Tobacco use: Secondary | ICD-10-CM | POA: Diagnosis not present

## 2024-03-28 DIAGNOSIS — E782 Mixed hyperlipidemia: Secondary | ICD-10-CM | POA: Diagnosis not present

## 2024-03-28 DIAGNOSIS — Z124 Encounter for screening for malignant neoplasm of cervix: Secondary | ICD-10-CM | POA: Diagnosis not present

## 2024-04-05 ENCOUNTER — Other Ambulatory Visit: Payer: Self-pay | Admitting: Physician Assistant

## 2024-04-05 DIAGNOSIS — Z1231 Encounter for screening mammogram for malignant neoplasm of breast: Secondary | ICD-10-CM

## 2024-05-04 ENCOUNTER — Ambulatory Visit

## 2024-05-19 ENCOUNTER — Ambulatory Visit
